# Patient Record
Sex: Male | Born: 1985 | Race: Black or African American | Hispanic: No | Marital: Married | Smoking: Never smoker
Health system: Southern US, Community
[De-identification: ages and names within clinical notes are randomized; demographics above are authoritative.]

## PROBLEM LIST (undated history)

## (undated) DIAGNOSIS — N489 Disorder of penis, unspecified: Secondary | ICD-10-CM

## (undated) HISTORY — PX: NO PAST SURGERIES: SHX2092

---

## 2016-01-29 ENCOUNTER — Emergency Department (HOSPITAL_BASED_OUTPATIENT_CLINIC_OR_DEPARTMENT_OTHER)
Admission: EM | Admit: 2016-01-29 | Discharge: 2016-01-29 | Disposition: A | Discharge status: Home / Self Care | Payer: Worker's Compensation | Attending: Emergency Medicine | Admitting: Emergency Medicine

## 2016-01-29 ENCOUNTER — Encounter (HOSPITAL_BASED_OUTPATIENT_CLINIC_OR_DEPARTMENT_OTHER): Payer: Self-pay | Admitting: *Deleted

## 2016-01-29 ENCOUNTER — Emergency Department (HOSPITAL_BASED_OUTPATIENT_CLINIC_OR_DEPARTMENT_OTHER): Payer: Worker's Compensation

## 2016-01-29 DIAGNOSIS — Y939 Activity, unspecified: Secondary | ICD-10-CM | POA: Insufficient documentation

## 2016-01-29 DIAGNOSIS — S40021A Contusion of right upper arm, initial encounter: Secondary | ICD-10-CM | POA: Diagnosis not present

## 2016-01-29 DIAGNOSIS — W228XXA Striking against or struck by other objects, initial encounter: Secondary | ICD-10-CM | POA: Insufficient documentation

## 2016-01-29 DIAGNOSIS — Y990 Civilian activity done for income or pay: Secondary | ICD-10-CM | POA: Diagnosis not present

## 2016-01-29 DIAGNOSIS — Y929 Unspecified place or not applicable: Secondary | ICD-10-CM | POA: Diagnosis not present

## 2016-01-29 DIAGNOSIS — S4991XA Unspecified injury of right shoulder and upper arm, initial encounter: Secondary | ICD-10-CM | POA: Diagnosis present

## 2016-01-29 NOTE — Discharge Instructions (Signed)
Contusion °A contusion is a deep bruise. Contusions are the result of a blunt injury to tissues and muscle fibers under the skin. The injury causes bleeding under the skin. The skin overlying the contusion may turn blue, purple, or yellow. Minor injuries will give you a painless contusion, but more severe contusions may stay painful and swollen for a few weeks.  °CAUSES  °This condition is usually caused by a blow, trauma, or direct force to an area of the body. °SYMPTOMS  °Symptoms of this condition include: °· Swelling of the injured area. °· Pain and tenderness in the injured area. °· Discoloration. The area may have redness and then turn blue, purple, or yellow. °DIAGNOSIS  °This condition is diagnosed based on a physical exam and medical history. An X-ray, CT scan, or MRI may be needed to determine if there are any associated injuries, such as broken bones (fractures). °TREATMENT  °Specific treatment for this condition depends on what area of the body was injured. In general, the best treatment for a contusion is resting, icing, applying pressure to (compression), and elevating the injured area. This is often called the RICE strategy. Over-the-counter anti-inflammatory medicines may also be recommended for pain control.  °HOME CARE INSTRUCTIONS  °· Rest the injured area. °· If directed, apply ice to the injured area: °· Put ice in a plastic bag. °· Place a towel between your skin and the bag. °· Leave the ice on for 20 minutes, 2-3 times per day. °· If directed, apply light compression to the injured area using an elastic bandage. Make sure the bandage is not wrapped too tightly. Remove and reapply the bandage as directed by your health care provider. °· If possible, raise (elevate) the injured area above the level of your heart while you are sitting or lying down. °· Take over-the-counter and prescription medicines only as told by your health care provider. °SEEK MEDICAL CARE IF: °· Your symptoms do not  improve after several days of treatment. °· Your symptoms get worse. °· You have difficulty moving the injured area. °SEEK IMMEDIATE MEDICAL CARE IF:  °· You have severe pain. °· You have numbness in a hand or foot. °· Your hand or foot turns pale or cold. °  °This information is not intended to replace advice given to you by your health care provider. Make sure you discuss any questions you have with your health care provider. °  °Document Released: 05/21/2005 Document Revised: 05/02/2015 Document Reviewed: 12/27/2014 °Elsevier Interactive Patient Education ©2016 [employer] ° °Cryotherapy °Cryotherapy means treatment with cold. Ice or gel packs can be used to reduce both pain and swelling. Ice is the most helpful within the first 24 to 48 hours after an injury or flare-up from overusing a muscle or joint. Sprains, strains, spasms, burning pain, shooting pain, and aches can all be eased with ice. Ice can also be used when recovering from surgery. Ice is effective, has very few side effects, and is safe for most people to use. °PRECAUTIONS  °Ice is not a safe treatment option for people with: °· Raynaud phenomenon. This is a condition affecting small blood vessels in the extremities. Exposure to cold may cause your problems to return. °· Cold hypersensitivity. There are many forms of cold hypersensitivity, including: °¨ Cold urticaria. Red, itchy hives appear on the skin when the tissues begin to warm after being iced. °¨ Cold erythema. This is a red, itchy rash caused by exposure to cold. °¨ Cold hemoglobinuria. Red blood cells   break down when the tissues begin to warm after being iced. The hemoglobin that carry oxygen are passed into the urine because they cannot combine with blood proteins fast enough. °· Numbness or altered sensitivity in the area being iced. °If you have any of the following conditions, do not use ice until you have discussed cryotherapy with your caregiver: °· Heart conditions, such as  arrhythmia, angina, or chronic heart disease. °· High blood pressure. °· Healing wounds or open skin in the area being iced. °· Current infections. °· Rheumatoid arthritis. °· Poor circulation. °· Diabetes. °Ice slows the blood flow in the region it is applied. This is beneficial when trying to stop inflamed tissues from spreading irritating chemicals to surrounding tissues. However, if you expose your skin to cold temperatures for too long or without the proper protection, you can damage your skin or nerves. Watch for signs of skin damage due to cold. °HOME CARE INSTRUCTIONS °Follow these tips to use ice and cold packs safely. °· Place a dry or damp towel between the ice and skin. A damp towel will cool the skin more quickly, so you may need to shorten the time that the ice is used. °· For a more rapid response, add gentle compression to the ice. °· Ice for no more than 10 to 20 minutes at a time. The bonier the area you are icing, the less time it will take to get the benefits of ice. °· Check your skin after 5 minutes to make sure there are no signs of a poor response to cold or skin damage. °· Rest 20 minutes or more between uses. °· Once your skin is numb, you can end your treatment. You can test numbness by very lightly touching your skin. The touch should be so light that you do not see the skin dimple from the pressure of your fingertip. When using ice, most people will feel these normal sensations in this order: cold, burning, aching, and numbness. °· Do not use ice on someone who cannot communicate their responses to pain, such as small children or people with dementia. °HOW TO MAKE AN ICE PACK °Ice packs are the most common way to use ice therapy. Other methods include ice massage, ice baths, and cryosprays. Muscle creams that cause a cold, tingly feeling do not offer the same benefits that ice offers and should not be used as a substitute unless recommended by your caregiver. °To make an ice pack, do one  of the following: °· Place crushed ice or a bag of frozen vegetables in a sealable plastic bag. Squeeze out the excess air. Place this bag inside another plastic bag. Slide the bag into a pillowcase or place a damp towel between your skin and the bag. °· Mix 3 parts water with 1 part rubbing alcohol. Freeze the mixture in a sealable plastic bag. When you remove the mixture from the freezer, it will be slushy. Squeeze out the excess air. Place this bag inside another plastic bag. Slide the bag into a pillowcase or place a damp towel between your skin and the bag. °SEEK MEDICAL CARE IF: °· You develop white spots on your skin. This may give the skin a blotchy (mottled) appearance. °· Your skin turns blue or pale. °· Your skin becomes waxy or hard. °· Your swelling gets worse. °MAKE SURE YOU:  °· Understand these instructions. °· Will watch your condition. °· Will get help right away if you are not doing well or get worse. °  °  This information is not intended to replace advice given to you by your health care provider. Make sure you discuss any questions you have with your health care provider. °  °Document Released: 04/07/2011 Document Revised: 09/01/2014 Document Reviewed: 04/07/2011 °Elsevier Interactive Patient Education ©2016 Else[employer] Inc. ° °

## 2016-01-29 NOTE — ED Provider Notes (Signed)
CSN: [id number]     Arrival date & time 01/29/16  1930 History   First MD Initiated Contact with Patient 01/29/16 2017     Chief Complaint  Patient presents with  . Arm Injury    HPI   30 year old male presents today with complaints of arm injury. Patient reports he was at work when a piece of wood struck his right forearm. Patient reports immediate pain and swelling to the right forearm. He reports full active range of motion of the wrist, sensation intact to the distal hand and fingers, he does report pain with flexion of the right thumb, full active range of motion and strength. No previous history of the same patient is right-handed, no minutes prior to arrival. Wounds.   History reviewed. No pertinent past medical history. History reviewed. No pertinent past surgical history. No family history on file. Social History  Substance Use Topics  . Smoking status: Never Smoker   . Smokeless tobacco: None  . Alcohol Use: No    Review of Systems  All other systems reviewed and are negative.   Allergies  Review of patient's allergies indicates no known allergies.  Home Medications   Prior to Admission medications   Not on File   BP 128/106 mmHg  Pulse 66  Temp(Src) 97.8 F (36.6 C) (Oral)  Resp 20  Ht  (1.727 m)  Wt 87.544 kg  BMI 29.35 kg/m2  SpO2 100% Physical Exam  Constitutional: He is oriented to person, place, and time. He appears well-developed and well-nourished.  HENT:  Head: Normocephalic and atraumatic.  Eyes: Conjunctivae are normal. Pupils are equal, round, and reactive to light. Right eye exhibits no discharge. Left eye exhibits no discharge. No scleral icterus.  Neck: Normal range of motion. No JVD present. No tracheal deviation present.  Pulmonary/Chest: Effort normal. No stridor.  Musculoskeletal:  Minimal swelling to the right forearm, full active range of motion of the wrist, sensation intact, grip strength 5 out of 5. Minor pain with extension of  the right thumb, radial pulses 2+, no open wounds.  Neurological: He is alert and oriented to person, place, and time. Coordination normal.  Psychiatric: He has a normal mood and affect. His behavior is normal. Judgment and thought content normal.  Nursing note and vitals reviewed.   ED Course  Procedures (including critical care time) Labs Review Labs Reviewed - No data to display  Imaging Review Dg Forearm Right  01/29/2016  CLINICAL DATA:  Right forearm pain status post trauma. EXAM: RIGHT FOREARM - 2 VIEW COMPARISON:  None. FINDINGS: There is no evidence of fracture or other focal bone lesions. Soft tissues are unremarkable. IMPRESSION: Negative. Electronically Signed   By: Ted Mcalpine M.D.   On: 01/29/2016 20:01   I have personally reviewed and evaluated these images and lab results as part of my medical decision-making.   EKG Interpretation None      MDM   Final diagnoses:  Arm contusion, right, initial encounter    Labs:  Imaging:DG forearm right negative  Consults:  Therapeutics:  Discharge Meds:   Assessment/Plan:Patient presents with contusion to his right forearm, negative x-rays, sensation, strength, range of motion intact. Patient will be instructed to use ice, rest, ibuprofen as needed for discomfort. Patient will be given light duty until Monday. 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 Patient verbalizes understanding and agreement today's plan, was given strict return precautions.  Eyvonne MechanicJeffrey Janyia Guion, PA-C 01/29/16 2049  Doug SouSam Jacubowitz, MD 01/29/16 2106

## 2016-01-29 NOTE — ED Notes (Signed)
Right forearm injury at work today. A piece of wood about 18" long hit was coming off a conveyor and hit him in the forearm. No drug screen required.

## 2016-12-25 ENCOUNTER — Other Ambulatory Visit: Payer: Self-pay | Admitting: Urology

## 2017-01-23 DIAGNOSIS — N489 Disorder of penis, unspecified: Secondary | ICD-10-CM

## 2017-01-23 HISTORY — DX: Disorder of penis, unspecified: N48.9

## 2017-01-28 ENCOUNTER — Encounter (HOSPITAL_BASED_OUTPATIENT_CLINIC_OR_DEPARTMENT_OTHER): Payer: Self-pay | Admitting: *Deleted

## 2017-01-28 NOTE — Progress Notes (Signed)
To [facility] at 0700-Hg on arrival-Npo after Mn. 

## 2017-02-06 ENCOUNTER — Ambulatory Visit (HOSPITAL_BASED_OUTPATIENT_CLINIC_OR_DEPARTMENT_OTHER): Payer: Managed Care, Other (non HMO) | Admitting: Anesthesiology

## 2017-02-06 ENCOUNTER — Encounter (HOSPITAL_BASED_OUTPATIENT_CLINIC_OR_DEPARTMENT_OTHER)
Admission: RE | Disposition: A | Discharge status: Home / Self Care | Payer: Self-pay | Source: Ambulatory Visit | Attending: Urology

## 2017-02-06 ENCOUNTER — Encounter (HOSPITAL_BASED_OUTPATIENT_CLINIC_OR_DEPARTMENT_OTHER): Payer: Self-pay | Admitting: *Deleted

## 2017-02-06 ENCOUNTER — Ambulatory Visit (HOSPITAL_BASED_OUTPATIENT_CLINIC_OR_DEPARTMENT_OTHER)
Admission: RE | Admit: 2017-02-06 | Discharge: 2017-02-06 | Disposition: A | Discharge status: Home / Self Care | Payer: Managed Care, Other (non HMO) | Source: Ambulatory Visit | Attending: Urology | Admitting: Urology

## 2017-02-06 DIAGNOSIS — N471 Phimosis: Secondary | ICD-10-CM | POA: Insufficient documentation

## 2017-02-06 DIAGNOSIS — N4889 Other specified disorders of penis: Secondary | ICD-10-CM | POA: Insufficient documentation

## 2017-02-06 DIAGNOSIS — Z8042 Family history of malignant neoplasm of prostate: Secondary | ICD-10-CM | POA: Diagnosis not present

## 2017-02-06 DIAGNOSIS — N469 Male infertility, unspecified: Secondary | ICD-10-CM | POA: Diagnosis not present

## 2017-02-06 DIAGNOSIS — N4821 Abscess of corpus cavernosum and penis: Secondary | ICD-10-CM

## 2017-02-06 DIAGNOSIS — L723 Sebaceous cyst: Secondary | ICD-10-CM | POA: Diagnosis not present

## 2017-02-06 HISTORY — DX: Disorder of penis, unspecified: N48.9

## 2017-02-06 HISTORY — PX: CIRCUMCISION: SHX1350

## 2017-02-06 LAB — POCT HEMOGLOBIN-HEMACUE: Hemoglobin: 18.2 g/dL — ABNORMAL HIGH (ref 13.0–17.0)

## 2017-02-06 SURGERY — CIRCUMCISION, ADULT
Anesthesia: General | Site: Penis

## 2017-02-06 MED ORDER — PROPOFOL 10 MG/ML IV BOLUS
INTRAVENOUS | Status: AC
  Filled 2017-02-06: qty 40

## 2017-02-06 MED ORDER — PROPOFOL 10 MG/ML IV BOLUS
INTRAVENOUS | Status: DC | PRN
  Administered 2017-02-06: 100 mg via INTRAVENOUS
  Administered 2017-02-06: 200 mg via INTRAVENOUS

## 2017-02-06 MED ORDER — MIDAZOLAM HCL 5 MG/5ML IJ SOLN
INTRAMUSCULAR | Status: DC | PRN
  Administered 2017-02-06: 2 mg via INTRAVENOUS

## 2017-02-06 MED ORDER — KETOROLAC TROMETHAMINE 30 MG/ML IJ SOLN
30.0000 mg | INTRAMUSCULAR | Status: DC
  Administered 2017-02-06: 30 mg via INTRAVENOUS
  Filled 2017-02-06: qty 1

## 2017-02-06 MED ORDER — BUPIVACAINE HCL 0.25 % IJ SOLN
INTRAMUSCULAR | Status: DC | PRN
  Administered 2017-02-06: 30 mL

## 2017-02-06 MED ORDER — CEFAZOLIN SODIUM-DEXTROSE 2-4 GM/100ML-% IV SOLN
INTRAVENOUS | Status: AC
  Filled 2017-02-06: qty 100

## 2017-02-06 MED ORDER — PROMETHAZINE HCL 25 MG/ML IJ SOLN
6.2500 mg | INTRAMUSCULAR | Status: DC | PRN
  Filled 2017-02-06: qty 1

## 2017-02-06 MED ORDER — FENTANYL CITRATE (PF) 100 MCG/2ML IJ SOLN
INTRAMUSCULAR | Status: DC | PRN
Start: 2017-02-06 — End: 2017-02-06
  Administered 2017-02-06: 100 ug via INTRAVENOUS

## 2017-02-06 MED ORDER — DOXYCYCLINE HYCLATE 50 MG PO CAPS: 100.0000 mg | ORAL_CAPSULE | Freq: Two times a day (BID) | ORAL | 0 refills | Status: DC

## 2017-02-06 MED ORDER — BACITRACIN-NEOMYCIN-POLYMYXIN 400-5-5000 EX OINT: 1.0000 "application " | TOPICAL_OINTMENT | Freq: Two times a day (BID) | CUTANEOUS | 0 refills | Status: DC

## 2017-02-06 MED ORDER — ACETAMINOPHEN 500 MG PO TABS
1000.0000 mg | ORAL_TABLET | ORAL | Status: AC
  Administered 2017-02-06: 1000 mg via ORAL
  Filled 2017-02-06: qty 2

## 2017-02-06 MED ORDER — DEXAMETHASONE SODIUM PHOSPHATE 4 MG/ML IJ SOLN
INTRAMUSCULAR | Status: DC | PRN
  Administered 2017-02-06: 10 mg via INTRAVENOUS

## 2017-02-06 MED ORDER — ONDANSETRON HCL 4 MG/2ML IJ SOLN
INTRAMUSCULAR | Status: DC | PRN
  Administered 2017-02-06: 4 mg via INTRAVENOUS

## 2017-02-06 MED ORDER — LACTATED RINGERS IV SOLN
INTRAVENOUS | Status: DC
  Administered 2017-02-06 (×3): via INTRAVENOUS
  Filled 2017-02-06: qty 1000

## 2017-02-06 MED ORDER — ACETAMINOPHEN 500 MG PO TABS
ORAL_TABLET | ORAL | Status: AC
  Filled 2017-02-06: qty 2

## 2017-02-06 MED ORDER — LIDOCAINE 2% (20 MG/ML) 5 ML SYRINGE
INTRAMUSCULAR | Status: DC | PRN
  Administered 2017-02-06: 100 mg via INTRAVENOUS

## 2017-02-06 MED ORDER — TRAMADOL-ACETAMINOPHEN 37.5-325 MG PO TABS: 1.0000 | ORAL_TABLET | Freq: Four times a day (QID) | ORAL | 0 refills | Status: DC | PRN

## 2017-02-06 MED ORDER — HYDROMORPHONE HCL 1 MG/ML IJ SOLN
0.2500 mg | INTRAMUSCULAR | Status: DC | PRN
  Filled 2017-02-06: qty 0.5

## 2017-02-06 MED ORDER — MIDAZOLAM HCL 2 MG/2ML IJ SOLN
INTRAMUSCULAR | Status: AC
  Filled 2017-02-06: qty 2

## 2017-02-06 MED ORDER — CEFAZOLIN SODIUM-DEXTROSE 2-4 GM/100ML-% IV SOLN
2.0000 g | INTRAVENOUS | Status: AC
  Administered 2017-02-06: 2 g via INTRAVENOUS
  Filled 2017-02-06: qty 100

## 2017-02-06 MED ORDER — FENTANYL CITRATE (PF) 100 MCG/2ML IJ SOLN
INTRAMUSCULAR | Status: AC
  Filled 2017-02-06: qty 2

## 2017-02-06 SURGICAL SUPPLY — 40 items
BANDAGE CO FLEX L/F 2IN X 5YD (GAUZE/BANDAGES/DRESSINGS) ×3 IMPLANT
BANDAGE COBAN STERILE 2 (GAUZE/BANDAGES/DRESSINGS) ×3 IMPLANT
BLADE CLIPPER SURG (BLADE) IMPLANT
BLADE SURG 15 STRL LF DISP TIS (BLADE) ×1 IMPLANT
BLADE SURG 15 STRL SS (BLADE) ×2
BNDG CONFORM 2 STRL LF (GAUZE/BANDAGES/DRESSINGS) ×3 IMPLANT
BRIEF STRETCH FOR OB PAD LRG (UNDERPADS AND DIAPERS) ×3 IMPLANT
COVER BACK TABLE 60X90IN (DRAPES) ×3 IMPLANT
COVER MAYO STAND STRL (DRAPES) ×3 IMPLANT
DERMABOND ADVANCED (GAUZE/BANDAGES/DRESSINGS) ×2
DERMABOND ADVANCED .7 DNX12 (GAUZE/BANDAGES/DRESSINGS) ×1 IMPLANT
DRAPE LAPAROTOMY 100X72 PEDS (DRAPES) ×3 IMPLANT
ELECT NEEDLE TIP 2.8 STRL (NEEDLE) ×3 IMPLANT
ELECT REM PT RETURN 9FT ADLT (ELECTROSURGICAL) ×3
ELECTRODE REM PT RTRN 9FT ADLT (ELECTROSURGICAL) ×1 IMPLANT
GAUZE SPONGE 4X4 8PLY STR LF (GAUZE/BANDAGES/DRESSINGS) ×3 IMPLANT
GLOVE BIO SURGEON STRL SZ 6.5 (GLOVE) ×2 IMPLANT
GLOVE BIO SURGEON STRL SZ7.5 (GLOVE) ×3 IMPLANT
GLOVE BIO SURGEONS STRL SZ 6.5 (GLOVE) ×1
GLOVE BIOGEL PI IND STRL 6.5 (GLOVE) ×1 IMPLANT
GLOVE BIOGEL PI IND STRL 7.5 (GLOVE) ×1 IMPLANT
GLOVE BIOGEL PI INDICATOR 6.5 (GLOVE) ×2
GLOVE BIOGEL PI INDICATOR 7.5 (GLOVE) ×2
GOWN STRL REUS W/ TWL LRG LVL3 (GOWN DISPOSABLE) IMPLANT
GOWN STRL REUS W/ TWL XL LVL3 (GOWN DISPOSABLE) IMPLANT
GOWN STRL REUS W/TWL LRG LVL3 (GOWN DISPOSABLE) ×6 IMPLANT
GOWN STRL REUS W/TWL XL LVL3 (GOWN DISPOSABLE)
KIT RM TURNOVER CYSTO AR (KITS) ×3 IMPLANT
NEEDLE HYPO 22GX1.5 SAFETY (NEEDLE) IMPLANT
NEEDLE HYPO 25X1 1.5 SAFETY (NEEDLE) IMPLANT
NS IRRIG 500ML POUR BTL (IV SOLUTION) ×3 IMPLANT
PACK BASIN DAY SURGERY FS (CUSTOM PROCEDURE TRAY) ×3 IMPLANT
PENCIL BUTTON HOLSTER BLD 10FT (ELECTRODE) ×3 IMPLANT
SUT MON AB 4-0 PC3 18 (SUTURE) ×12 IMPLANT
SUT VIC AB 3-0 SH 27 (SUTURE) ×2
SUT VIC AB 3-0 SH 27X BRD (SUTURE) ×1 IMPLANT
SYR CONTROL 10ML LL (SYRINGE) ×3 IMPLANT
TOWEL OR 17X24 6PK STRL BLUE (TOWEL DISPOSABLE) ×6 IMPLANT
TRAY DSU PREP LF (CUSTOM PROCEDURE TRAY) ×3 IMPLANT
WATER STERILE IRR 500ML POUR (IV SOLUTION) IMPLANT

## 2017-02-06 NOTE — Anesthesia Postprocedure Evaluation (Signed)
Anesthesia Post Note  Patient: Armanda HeritageJason Perrault  Procedure(s) Performed: Procedure(s) (LRB): CIRCUMCISION ADULT (N/A)     Patient location during evaluation: PACU Anesthesia Type: General Level of consciousness: awake and alert Pain management: pain level controlled Vital Signs Assessment: post-procedure vital signs reviewed and stable Respiratory status: spontaneous breathing, nonlabored ventilation, respiratory function stable and patient connected to nasal cannula oxygen Cardiovascular status: blood pressure returned to baseline and stable Postop Assessment: no signs of nausea or vomiting Anesthetic complications: no    Last Vitals:  Vitals:   02/06/17 1030 02/06/17 1045  BP: (!) 117/57 128/74  Pulse: (!) 58 83  Resp: 19 18  Temp:      Last Pain:  Vitals:   02/06/17 1115  TempSrc:   PainSc: 0-No pain                 Almee Pelphrey,JAMES TERRILL

## 2017-02-06 NOTE — H&P (Signed)
Office Visit Report     12/19/2016   --------------------------------------------------------------------------------   Andre Gerlach. Carey  MRN: [id number]  PRIMARY CARE:    DOB: 1985-09-02, 31 year old Male  REFERRING:     PROVIDER:  Jethro Carey, M.D.    LOCATION:  [facility] [phone]   --------------------------------------------------------------------------------   CC: I have penile edema.  HPI: Andre Carey is a 31 year-old male patient who is here for penile edema.  His penile edema occurred approximately 11/24/1998. He does not have pain. He does not have bruising. He does not have redness in his skin.   His condition does not cause restriction of normal activities.   Distal penile/foreskin mass that has been getting larger over 18 yrs. We have discussed him having a circumcision.     CC: I am infertile.  HPI: He has not gotten a woman pregnant before. He and his mate have been trying to have children for 2 years. His wife has not been evaluated by her gynecologist. He has not had a sperm count done.   He does not have problems with erections. He does not have normal ejaculate volume. He does not have trouble reaching climax.   He does not have a history of exposure to chemicals or fumes. He does not wear boxer shorts. He does not have a history of smoking marijuana. He does not take regular sauna baths or spend time in a hot tub. He has not had injuries to the testicles or scrotum. He has not received radiation therapy. He has not been treated with chemotherapy in the past.   Wife gynecologist is Dr. Festus Carey.     ALLERGIES: None   MEDICATIONS: None   GU PSH: None   NON-GU PSH: None   GU PMH: None   NON-GU PMH: None   FAMILY HISTORY: Deceased - Father Prostate Cancer - Runs in Family   SOCIAL HISTORY: Marital Status: Married Current Smoking Status: Patient has never smoked.   Tobacco Use Assessment Completed: Used Tobacco in last  30 days? Has never drank.  Does not drink caffeine. Patient's occupation Furniture conservator/restorer.    REVIEW OF SYSTEMS:    GU Review Male:   penile cyst/lesion. Patient denies frequent urination, hard to postpone urination, burning/ pain with urination, get up at night to urinate, leakage of urine, stream starts and stops, trouble starting your stream, have to strain to urinate , erection problems, and penile pain.  Gastrointestinal (Upper):   Patient denies nausea, vomiting, and indigestion/ heartburn.  Gastrointestinal (Lower):   Patient denies diarrhea and constipation.  Constitutional:   Patient denies fever, night sweats, weight loss, and fatigue.  Skin:   Patient denies skin rash/ lesion and itching.  Eyes:   Patient denies blurred vision and double vision.  Ears/ Nose/ Throat:   Patient denies sore throat and sinus problems.  Hematologic/Lymphatic:   Patient denies easy bruising and swollen glands.  Cardiovascular:   Patient denies leg swelling and chest pains.  Respiratory:   Patient denies cough and shortness of breath.  Endocrine:   Patient denies excessive thirst.  Musculoskeletal:   Patient denies back pain and joint pain.  Neurological:   Patient denies headaches and dizziness.  Psychologic:   Patient denies depression and anxiety.   VITAL SIGNS:      12/19/2016 02:25 PM  Weight 192 lb / 87.09 kg  Height 67 in / 170.18 cm  BP 117/73 mmHg  Pulse 48 /min  BMI 30.1  kg/m   GU PHYSICAL EXAMINATION:    Anus and Perineum: No hemorrhoids. No anal stenosis. No rectal fissure, no anal fissure. No edema, no dimple, no perineal tenderness, no anal tenderness.  Scrotum: No lesions. No edema. No cysts. No warts.  Epididymides: Right: no spermatocele, no masses, no cysts, no tenderness, no induration, no enlargement. Left: no spermatocele, no masses, no cysts, no tenderness, no induration, no enlargement.  Testes: No tenderness, no swelling, no enlargement left testes. No tenderness, no  swelling, no enlargement right testes. Normal location left testes. Normal location right testes. No mass, no cyst, no varicocele, no hydrocele left testes. No mass, no cyst, no varicocele, no hydrocele right testes.  Urethral Meatus: Normal size. No lesion, no wart, no discharge, no polyp. Normal location.  Penis: Circumcised, no warts, no cracks. No dorsal Peyronie's plaques, no left corporal Peyronie's plaques, no right corporal Peyronie's plaques, no scarring, no warts. No balanitis, no meatal stenosis.  Prostate: 40 gram or 2+ size. Left lobe normal consistency, right lobe normal consistency. Symmetrical lobes. No prostate nodule. Left lobe no tenderness, right lobe no tenderness.  Seminal Vesicles: Nonpalpable.  Sphincter Tone: Normal sphincter. No rectal tenderness. No rectal mass.    MULTI-SYSTEM PHYSICAL EXAMINATION:    Constitutional: Well-nourished. No physical deformities. Normally developed. Good grooming.  Neck: Neck symmetrical, not swollen. Normal tracheal position.  Respiratory: No labored breathing, no use of accessory muscles.   Cardiovascular: Normal temperature, normal extremity pulses, no swelling, no varicosities.  Lymphatic: No enlargement of neck, axillae, groin.  Skin: No paleness, no jaundice, no cyanosis. No lesion, no ulcer, no rash.  Neurologic / Psychiatric: Oriented to time, oriented to place, oriented to person. No depression, no anxiety, no agitation.  Gastrointestinal: No mass, no tenderness, no rigidity, non obese abdomen.  Eyes: Normal conjunctivae. Normal eyelids.  Ears, Nose, Mouth, and Throat: Left ear no scars, no lesions, no masses. Right ear no scars, no lesions, no masses. Nose no scars, no lesions, no masses. Normal hearing. Normal lips.  Musculoskeletal: Normal gait and station of head and neck.     PAST DATA REVIEWED:  Source Of History:  Patient   PROCEDURES:         Scrotal Ultrasound - 16109  Right Testicle: Length: 3.7 cm Height:1.8  cm Width: 2.5 cm   Left Testicle: Length: 3.0 cm Height: 1.7 cm Width: 2.6 cm   Left Testis/Epididymis:  Testicle appears WNLs with blood flow, Multiple sub- centi cystic areas seen in epi head, largest= 0.34 Appears Septated?, No varicocele seen, Hydrocele: Small amount of fluid.  Right Testis/Epididymis:  Testicle seen with blood flow, cystic area seen within= 0.34cm, Calcs noted in epi body, no varicocele seen, Hydrocele: Small amount of fluid.         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 I advised him to have circumcision for this. In addition, the patient is concerned with possible infertility, as he was lightheaded used unprotected intercourse for 2 years, he believes he is having a decreased volume, and because he has testicles are small. Ultrasound of the scrotum shows that the testicle 3.7 x  1.8 x 2.5 cm, with several cysts noted in the left epididymal head. There are no testicular masses. There is no varicocele noted. The internal blood flow is otherwise normal. There is a 0.34 cm cystic area noted in the right testis.   The patient has semen analysis, and, if abnormal, we will begin laboratory testing. It is normal, we will consider other options for him.    PLAN:           Orders X-Rays: Scrotal Ultrasound          Schedule Labs: 1 Week - Semen Analysis Complete  Lab Notes: To be done by Dr. April MansonYalcinkaya  Return Visit/Planned Activity: 4-6 Weeks - Office Visit             Note: To review semen analysis & scrotal u/s results  Return Visit/Planned Activity: Next Available Appointment - Schedule Surgery          Document Letter(s):  Created for Patient: Clinical Summary         Notes:   scrotal u/s  Semen analysis  Hold  blood work pending SA  Recommend circumcision        The information contained in this medical record document is considered private and confidential patient information. This information can only be used for the medical diagnosis and/or medical services that are being provided by the patient's selected caregivers. This information can only be distributed outside of the patient's care if the patient agrees and signs waivers of authorization for this information to be sent to an outside source or route.

## 2017-02-06 NOTE — Anesthesia Procedure Notes (Signed)
Procedure Name: LMA Insertion Date/Time: 02/06/2017 8:45 AM Performed by: Tyrone NineSAUVE, Donatello Kleve F Pre-anesthesia Checklist: Patient identified, Timeout performed, Emergency Drugs available, Suction available and Patient being monitored Patient Re-evaluated:Patient Re-evaluated prior to inductionOxygen Delivery Method: Circle system utilized Preoxygenation: Pre-oxygenation with 100% oxygen Intubation Type: IV induction Ventilation: Mask ventilation without difficulty LMA: LMA inserted LMA Size: 4.0 Number of attempts: 1 Placement Confirmation: breath sounds checked- equal and bilateral and positive ETCO2 Tube secured with: Tape Dental Injury: Teeth and Oropharynx as per pre-operative assessment

## 2017-02-06 NOTE — Op Note (Signed)
Pre-operative diagnosis :  Sebaceous cyst, distal foreskin  Postoperative diagnosis: sebaceous cyst abscess, distal foreskin  Operation: circumcision, culture of sebaceous abscess  Surgeon:  S. Patsi Searsannenbaum, MD  First assistant:  none  Anesthesia:  General LMA  Preparation:  After appropriate premedication, the patient was brought to the operative room, placed on the operating table in the dorsal supine position where general LMA anesthesia was introduced. He remained in the supine position, where the penis was prepped with Betadine solution and draped in the usual fashion. The arm band was double checked. The history was reviewed.  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 I advised him to have circumcision for this. In addition, the patient is concerned with possible infertility, as he was lightheaded used unprotected intercourse for 2 years, he believes he is having a decreased volume, and because he has testicles are small. Ultrasound of the scrotum shows that the testicle 3.7 x 1.8 x 2.5 cm, with several cysts noted in the left epididymal head. There are no testicular masses. There is no varicocele noted. The internal blood flow is otherwise normal. There is a 0.34 cm cystic area noted in the right testis.   The patient has semen analysis, and, if abnormal, we will begin laboratory testing. It is normal, we will consider other options for him.    Statement of  Likelihood of Success: Excellent. TIME-OUT observed.:  Procedure: Outline of circumcision was accomplished using a blue marking pen, and the pericarinal and periglandular areas. In the periglandular area, a large 1.5 cm sebaceous cyst was identified, in the midline. This required dissection at of the  frenulum off of the glans at 6:00. Following dissection, the frenular attachment was incised, and ligated with 3-0 Vicryl suture. The proximal ligature yielded fluid, however, which appeared to be pus. This was cultured and drained. The wound was irrigated with saline.  Circumcising sizing incisions were concluded using a 15 blade, leaving a collar of tissue around the corona of the penis.  4 separate quadrants of 4-0 Monocryl were then created, and each quadrant was closed with 4-0 Monocryl suture. Sterile dressing was applied. The patient was awakened and taken to recovery in good condition. Note that a penile block was placed at the beginning of the procedure, using 30 mL of 1/4% Marcaine plain solution.

## 2017-02-06 NOTE — Anesthesia Preprocedure Evaluation (Addendum)
Anesthesia Evaluation  Patient identified by MRN, date of birth, ID band Patient awake    Reviewed: Allergy & Precautions, NPO status , Patient's Chart, lab work & pertinent test results  Airway Mallampati: I       Dental no notable dental hx. (+) Teeth Intact, Partial Upper, Dental Advisory Given,    Pulmonary neg pulmonary ROS,    breath sounds clear to auscultation       Cardiovascular Exercise Tolerance: Good negative cardio ROS   Rhythm:Regular Rate:Normal     Neuro/Psych negative neurological ROS  negative psych ROS   GI/Hepatic negative GI ROS, Neg liver ROS,   Endo/Other  negative endocrine ROS  Renal/GU negative Renal ROS   Penile lesion negative genitourinary   Musculoskeletal negative musculoskeletal ROS (+)   Abdominal   Peds negative pediatric ROS (+)  Hematology negative hematology ROS (+)   Anesthesia Other Findings   Reproductive/Obstetrics negative OB ROS                           Anesthesia Physical Anesthesia Plan  ASA: I  Anesthesia Plan: General   Post-op Pain Management:    Induction: Intravenous  PONV Risk Score and Plan: 2 and Ondansetron and Dexamethasone  Airway Management Planned: LMA  Additional Equipment:   Intra-op Plan:   Post-operative Plan: Extubation in OR  Informed Consent: I have reviewed the patients History and Physical, chart, labs and discussed the procedure including the risks, benefits and alternatives for the proposed anesthesia with the patient or authorized representative who has indicated his/her understanding and acceptance.   Dental advisory given  Plan Discussed with: CRNA  Anesthesia Plan Comments:         Anesthesia Quick Evaluation

## 2017-02-06 NOTE — Interval H&P Note (Signed)
History and Physical Interval Note:  02/06/2017 8:37 AM  Andre Carey  has presented today for surgery, with the diagnosis of DISTAL PENILE SKIN LESION  The various methods of treatment have been discussed with the patient and family. After consideration of risks, benefits and other options for treatment, the patient has consented to  Procedure(s): CIRCUMCISION ADULT (N/A) as a surgical intervention .  The patient's history has been reviewed, patient examined, no change in status, stable for surgery.  I have reviewed the patient's chart and labs.  Questions were answered to the patient's satisfaction.     Kemar Pandit I Gagandeep Kossman

## 2017-02-06 NOTE — Transfer of Care (Signed)
Immediate Anesthesia Transfer of Care Note  Patient: Andre HeritageJason Carey  Procedure(s) Performed: Procedure(s): CIRCUMCISION ADULT (N/A)  Patient Location: PACU  Anesthesia Type:General  Level of Consciousness: awake, alert , oriented and patient cooperative  Airway & Oxygen Therapy: Patient Spontanous Breathing and Patient connected to nasal cannula oxygen  Post-op Assessment: Report given to RN and Post -op Vital signs reviewed and stable  Post vital signs: Reviewed and stable  Last Vitals:  Vitals:   02/06/17 0719  BP: 125/67  Pulse: (!) 57  Resp: 16  Temp: 36.8 C    Last Pain:  Vitals:   02/06/17 0719  TempSrc: Oral      Patients Stated Pain Goal: 5 (02/06/17 0737)  Complications: No apparent anesthesia complications

## 2017-02-06 NOTE — Discharge Instructions (Addendum)
Circumcision-Home Care Instructions  The following instructions have been prepared to help you care for yourself upon your return home today.   Wound Care & Hygiene:   You may apply ice to the penis.  This may help to decrease swelling.  Remove the dressing tomorrow.  If the dressing falls off before then, leave it off.  You may shower or bathe in 48 hours  Gently wash the penis with soap and water.  The stitches do not need to be removed.  Activity:  Do not drive or operate any equipment today.  The effects of anesthesia are still present, drowsiness may result.  Rest today, not necessarily flat bed rest, just take it easy.  You may resume your normal activity in one to two days or as indicated by your physician.  Sexual Activity:  Erection and sexual relations should be avoided for *2 weeks.  Return to Work:  One to two days or as indicated by your physician   Diet:  Drink liquids or eat a very light diet this evening.  You may resume a regular diet tomorrow.  General Expectations of your surgery:   You may have a small amount of bleeding  The penis will be swollen and bruised for approximately one week  You may wake during the night with an erection, usually this is caused by having a full bladder so you should try to urinate (pass your water) to relieve the erection or apply ice to the penis  Unexpected Observations - Call your doctor if these occur!  Persistent or heavy bleeding  Temperature of 101 degrees or more  Severe pain not relieved by medication  Return to Office .  Call to schedule your appointment   Additional Instructions:  Use neosporin ointment on penis 2x/day.      Post Anesthesia Home Care Instructions  Activity: Get plenty of rest for the remainder of the day. A responsible individual must stay with you for 24 hours following the procedure.  For the next 24 hours, DO NOT: -Drive a car -Advertising copywriterperate machinery -Drink alcoholic beverages -Take any  medication unless instructed by your physician -Make any legal decisions or sign important papers.  Meals: Start with liquid foods such as gelatin or soup. Progress to regular foods as tolerated. Avoid greasy, spicy, heavy foods. If nausea and/or vomiting occur, drink only clear liquids until the nausea and/or vomiting subsides. Call your physician if vomiting continues.  Special Instructions/Symptoms: Your throat may feel dry or sore from the anesthesia or the breathing tube placed in your throat during surgery. If this causes discomfort, gargle with warm salt water. The discomfort should disappear within 24 hours.  If you had a scopolamine patch placed behind your ear for the management of post- operative nausea and/or vomiting:  1. The medication in the patch is effective for 72 hours, after which it should be removed.  Wrap patch in a tissue and discard in the trash. Wash hands thoroughly with soap and water. 2. You may remove the patch earlier than 72 hours if you experience unpleasant side effects which may include dry mouth, dizziness or visual disturbances. 3. Avoid touching the patch. Wash your hands with soap and water after contact with the patch.

## 2017-02-09 ENCOUNTER — Encounter (HOSPITAL_BASED_OUTPATIENT_CLINIC_OR_DEPARTMENT_OTHER): Payer: Self-pay | Admitting: Urology

## 2017-02-11 LAB — AEROBIC/ANAEROBIC CULTURE (SURGICAL/DEEP WOUND): CULTURE: NO GROWTH

## 2017-02-11 LAB — AEROBIC/ANAEROBIC CULTURE W GRAM STAIN (SURGICAL/DEEP WOUND)

## 2020-05-01 ENCOUNTER — Inpatient Hospital Stay (HOSPITAL_COMMUNITY)
Admission: EM | Admit: 2020-05-01 | Discharge: 2020-05-05 | DRG: 871 | Disposition: A | Discharge status: Home / Self Care | Payer: No Typology Code available for payment source | Attending: Internal Medicine | Admitting: Internal Medicine

## 2020-05-01 ENCOUNTER — Emergency Department (HOSPITAL_COMMUNITY): Payer: No Typology Code available for payment source

## 2020-05-01 ENCOUNTER — Encounter (HOSPITAL_COMMUNITY): Payer: Self-pay | Admitting: Emergency Medicine

## 2020-05-01 ENCOUNTER — Inpatient Hospital Stay (HOSPITAL_COMMUNITY): Payer: No Typology Code available for payment source

## 2020-05-01 DIAGNOSIS — J9601 Acute respiratory failure with hypoxia: Secondary | ICD-10-CM | POA: Diagnosis present

## 2020-05-01 DIAGNOSIS — R6520 Severe sepsis without septic shock: Secondary | ICD-10-CM | POA: Diagnosis present

## 2020-05-01 DIAGNOSIS — D696 Thrombocytopenia, unspecified: Secondary | ICD-10-CM | POA: Diagnosis present

## 2020-05-01 DIAGNOSIS — D6959 Other secondary thrombocytopenia: Secondary | ICD-10-CM | POA: Diagnosis present

## 2020-05-01 DIAGNOSIS — U071 COVID-19: Secondary | ICD-10-CM | POA: Diagnosis present

## 2020-05-01 DIAGNOSIS — R042 Hemoptysis: Secondary | ICD-10-CM | POA: Diagnosis present

## 2020-05-01 DIAGNOSIS — R739 Hyperglycemia, unspecified: Secondary | ICD-10-CM | POA: Diagnosis present

## 2020-05-01 DIAGNOSIS — D72819 Decreased white blood cell count, unspecified: Secondary | ICD-10-CM | POA: Diagnosis present

## 2020-05-01 DIAGNOSIS — E871 Hypo-osmolality and hyponatremia: Secondary | ICD-10-CM | POA: Diagnosis present

## 2020-05-01 DIAGNOSIS — J1282 Pneumonia due to coronavirus disease 2019: Secondary | ICD-10-CM | POA: Diagnosis present

## 2020-05-01 DIAGNOSIS — R748 Abnormal levels of other serum enzymes: Secondary | ICD-10-CM | POA: Diagnosis present

## 2020-05-01 DIAGNOSIS — R0902 Hypoxemia: Secondary | ICD-10-CM | POA: Diagnosis present

## 2020-05-01 DIAGNOSIS — A4189 Other specified sepsis: Principal | ICD-10-CM | POA: Diagnosis present

## 2020-05-01 DIAGNOSIS — R7303 Prediabetes: Secondary | ICD-10-CM | POA: Diagnosis present

## 2020-05-01 LAB — COMPREHENSIVE METABOLIC PANEL
ALT: 104 U/L — ABNORMAL HIGH (ref 0–44)
AST: 181 U/L — ABNORMAL HIGH (ref 15–41)
Albumin: 3.1 g/dL — ABNORMAL LOW (ref 3.5–5.0)
Alkaline Phosphatase: 52 U/L (ref 38–126)
Anion gap: 9 (ref 5–15)
BUN: 8 mg/dL (ref 6–20)
CO2: 25 mmol/L (ref 22–32)
Calcium: 8.3 mg/dL — ABNORMAL LOW (ref 8.9–10.3)
Chloride: 95 mmol/L — ABNORMAL LOW (ref 98–111)
Creatinine, Ser: 1.41 mg/dL — ABNORMAL HIGH (ref 0.61–1.24)
GFR calc Af Amer: >60 mL/min (ref >60)
GFR calc non Af Amer: >60 mL/min (ref >60)
Glucose, Bld: 132 mg/dL — ABNORMAL HIGH (ref 70–99)
Potassium: 3.7 mmol/L (ref 3.5–5.1)
Sodium: 129 mmol/L — ABNORMAL LOW (ref 135–145)
Total Bilirubin: 0.8 mg/dL (ref 0.3–1.2)
Total Protein: 6.5 g/dL (ref 6.5–8.1)

## 2020-05-01 LAB — LACTIC ACID, PLASMA: Lactic Acid, Venous: 1.4 mmol/L (ref 0.5–1.9)

## 2020-05-01 LAB — CBC
HCT: 49.6 % (ref 39.0–52.0)
Hemoglobin: 16.8 g/dL (ref 13.0–17.0)
MCH: 29.1 pg (ref 26.0–34.0)
MCHC: 33.9 g/dL (ref 30.0–36.0)
MCV: 85.8 fL (ref 80.0–100.0)
Platelets: 131 10*3/uL — ABNORMAL LOW (ref 150–400)
RBC: 5.78 MIL/uL (ref 4.22–5.81)
RDW: 11.7 % (ref 11.5–15.5)
WBC: 3.3 10*3/uL — ABNORMAL LOW (ref 4.0–10.5)
nRBC: 0 % (ref 0.0–0.2)

## 2020-05-01 LAB — FIBRINOGEN: Fibrinogen: 531 mg/dL — ABNORMAL HIGH (ref 210–475)

## 2020-05-01 LAB — SARS CORONAVIRUS 2 BY RT PCR (HOSPITAL ORDER, PERFORMED IN ~~LOC~~ HOSPITAL LAB): SARS Coronavirus 2: POSITIVE — AB

## 2020-05-01 LAB — BASIC METABOLIC PANEL
Anion gap: 13 (ref 5–15)
BUN: 8 mg/dL (ref 6–20)
CO2: 25 mmol/L (ref 22–32)
Calcium: 8.8 mg/dL — ABNORMAL LOW (ref 8.9–10.3)
Chloride: 94 mmol/L — ABNORMAL LOW (ref 98–111)
Creatinine, Ser: 1.44 mg/dL — ABNORMAL HIGH (ref 0.61–1.24)
GFR calc Af Amer: >60 mL/min (ref >60)
GFR calc non Af Amer: >60 mL/min (ref >60)
Glucose, Bld: 135 mg/dL — ABNORMAL HIGH (ref 70–99)
Potassium: 3.8 mmol/L (ref 3.5–5.1)
Sodium: 132 mmol/L — ABNORMAL LOW (ref 135–145)

## 2020-05-01 LAB — TRIGLYCERIDES: Triglycerides: 150 mg/dL — ABNORMAL HIGH (ref <150)

## 2020-05-01 LAB — TROPONIN I (HIGH SENSITIVITY)
Troponin I (High Sensitivity): 15 ng/L (ref <18)
Troponin I (High Sensitivity): 13 ng/L (ref <18)

## 2020-05-01 LAB — PROCALCITONIN: Procalcitonin: 0.45 ng/mL

## 2020-05-01 LAB — HIV ANTIBODY (ROUTINE TESTING W REFLEX): HIV Screen 4th Generation wRfx: NONREACTIVE

## 2020-05-01 LAB — D-DIMER, QUANTITATIVE: D-Dimer, Quant: 1.82 ug/mL-FEU — ABNORMAL HIGH (ref 0.00–0.50)

## 2020-05-01 LAB — C-REACTIVE PROTEIN: CRP: 6.6 mg/dL — ABNORMAL HIGH (ref <1.0)

## 2020-05-01 LAB — LACTATE DEHYDROGENASE: LDH: 755 U/L — ABNORMAL HIGH (ref 98–192)

## 2020-05-01 LAB — BRAIN NATRIURETIC PEPTIDE: B Natriuretic Peptide: 7.6 pg/mL (ref 0.0–100.0)

## 2020-05-01 LAB — HEPATITIS B SURFACE ANTIGEN: Hepatitis B Surface Ag: NONREACTIVE

## 2020-05-01 LAB — FERRITIN: Ferritin: 3111 ng/mL — ABNORMAL HIGH (ref 24–336)

## 2020-05-01 MED ORDER — METHYLPREDNISOLONE SODIUM SUCC 125 MG IJ SOLR
1.0000 mg/kg | Freq: Two times a day (BID) | INTRAMUSCULAR | Status: DC
  Administered 2020-05-01: 86.25 mg via INTRAVENOUS
  Filled 2020-05-01: qty 2

## 2020-05-01 MED ORDER — HYDROCOD POLST-CPM POLST ER 10-8 MG/5ML PO SUER: 5.0000 mL | Freq: Two times a day (BID) | ORAL | Status: DC | PRN

## 2020-05-01 MED ORDER — PREDNISONE 20 MG PO TABS: 50.0000 mg | ORAL_TABLET | Freq: Every day | ORAL | Status: DC

## 2020-05-01 MED ORDER — SODIUM CHLORIDE 0.9 % IV SOLN
200.0000 mg | Freq: Once | INTRAVENOUS | Status: AC
  Administered 2020-05-01: 200 mg via INTRAVENOUS
  Filled 2020-05-01: qty 40

## 2020-05-01 MED ORDER — LACTATED RINGERS IV BOLUS: 500.0000 mL | Freq: Once | INTRAVENOUS | Status: DC

## 2020-05-01 MED ORDER — ALBUTEROL SULFATE HFA 108 (90 BASE) MCG/ACT IN AERS
2.0000 | INHALATION_SPRAY | Freq: Four times a day (QID) | RESPIRATORY_TRACT | Status: DC
  Administered 2020-05-01 – 2020-05-04 (×12): 2 via RESPIRATORY_TRACT
  Filled 2020-05-01 (×4): qty 6.7

## 2020-05-01 MED ORDER — ZINC SULFATE 220 (50 ZN) MG PO CAPS
220.0000 mg | ORAL_CAPSULE | Freq: Every day | ORAL | Status: DC
  Administered 2020-05-01 – 2020-05-04 (×4): 220 mg via ORAL
  Filled 2020-05-01 (×5): qty 1

## 2020-05-01 MED ORDER — SODIUM CHLORIDE 0.9 % IV SOLN
100.0000 mg | INTRAVENOUS | Status: DC
  Filled 2020-05-01: qty 20

## 2020-05-01 MED ORDER — ACETAMINOPHEN 325 MG PO TABS
650.0000 mg | ORAL_TABLET | Freq: Once | ORAL | Status: AC | PRN
  Administered 2020-05-01: 650 mg via ORAL

## 2020-05-01 MED ORDER — ENOXAPARIN SODIUM 40 MG/0.4ML ~~LOC~~ SOLN
40.0000 mg | SUBCUTANEOUS | Status: DC
  Administered 2020-05-01: 40 mg via SUBCUTANEOUS
  Filled 2020-05-01: qty 0.4

## 2020-05-01 MED ORDER — DEXAMETHASONE SODIUM PHOSPHATE 10 MG/ML IJ SOLN: 10.0000 mg | Freq: Once | INTRAMUSCULAR | Status: DC

## 2020-05-01 MED ORDER — ACETAMINOPHEN 325 MG PO TABS: 650.0000 mg | ORAL_TABLET | Freq: Four times a day (QID) | ORAL | Status: DC | PRN

## 2020-05-01 MED ORDER — ACETAMINOPHEN 325 MG PO TABS: 325.0000 mg | ORAL_TABLET | Freq: Once | ORAL | Status: DC

## 2020-05-01 MED ORDER — GUAIFENESIN-DM 100-10 MG/5ML PO SYRP: 10.0000 mL | ORAL_SOLUTION | ORAL | Status: DC | PRN

## 2020-05-01 MED ORDER — SODIUM CHLORIDE 0.9 % IV SOLN: 200.0000 mg | Freq: Once | INTRAVENOUS | Status: DC

## 2020-05-01 MED ORDER — SODIUM CHLORIDE 0.9 % IV SOLN: 100.0000 mg | Freq: Every day | INTRAVENOUS | Status: DC

## 2020-05-01 MED ORDER — ONDANSETRON HCL 4 MG/2ML IJ SOLN: 4.0000 mg | Freq: Four times a day (QID) | INTRAMUSCULAR | Status: DC | PRN

## 2020-05-01 MED ORDER — ASCORBIC ACID 500 MG PO TABS
500.0000 mg | ORAL_TABLET | Freq: Every day | ORAL | Status: DC
  Administered 2020-05-01 – 2020-05-05 (×5): 500 mg via ORAL
  Filled 2020-05-01 (×5): qty 1

## 2020-05-01 MED ORDER — ONDANSETRON HCL 4 MG PO TABS: 4.0000 mg | ORAL_TABLET | Freq: Four times a day (QID) | ORAL | Status: DC | PRN

## 2020-05-01 NOTE — ED Provider Notes (Signed)
[facility] EMERGENCY DEPARTMENT Provider Note   CSN: [id number] Arrival date & time: 05/01/20  1442     History Chief Complaint  Patient presents with  . Shortness of Breath  . Covid Positive    Andre Carey is a 34 y.o. male.  HPI Patient is a 34 year old male with no pertinent past medical history no chronic medical conditions on no medications presented today with 1 week of myalgias, cough, congestion, fatigue, malaise, fevers, chills nausea without vomiting and loose stools 1 episodes per day.   Patient states that he took home test on Sunday which was positive.  He has not been tested formally.  He has taken no medications prior to arrival today but has taken Tylenol sporadically over the past week.  He denies any chest pain or chest tightness but states that he does feel that he is having to work harder to breathe and that is more difficult progressively over the past 3 days.  He states that today he felt that he can only tolerate his symptoms and came to emergency department for evaluation.   He denies any history of blood clots, no medication no murmur is not a cancer patient, no recent surgery or immobilizations.  No unilateral leg swelling.  He does endorse some hemoptysis but states that it has been scant and irregular mostly when he is coughing very hard.  He denies any chest pain.     Past Medical History:  Diagnosis Date  . Penile lesion 01/2017    There are no problems to display for this patient.   Past Surgical History:  Procedure Laterality Date  . CIRCUMCISION N/A 02/06/2017   Procedure: CIRCUMCISION ADULT;  Surgeon: Jethro Bolus, MD;  Location: [facility];  Service: Urology;  Laterality: N/A;  . NO PAST SURGERIES         No family history on file.  Social History   Tobacco Use  . Smoking status: Never Smoker  . Smokeless tobacco: Never Used  Substance Use Topics  . Alcohol use: No  . Drug use: No     Home Medications Prior to Admission medications   Not on File    Allergies    Patient has no known allergies.  Review of Systems   Review of Systems  Constitutional: Positive for activity change, appetite change, chills, fatigue and fever. Negative for unexpected weight change.  HENT: Positive for congestion, rhinorrhea and sore throat.   Eyes: Negative for pain.  Respiratory: Positive for cough, chest tightness and shortness of breath.   Cardiovascular: Negative for chest pain and leg swelling.  Gastrointestinal: Positive for diarrhea and nausea. Negative for abdominal pain and vomiting.  Genitourinary: Negative for dysuria.  Musculoskeletal: Positive for myalgias.  Skin: Negative for rash.  Neurological: Negative for dizziness and headaches.    Physical Exam Updated Vital Signs BP 120/90   Pulse 98   Temp (!) 103 F (39.4 C) (Oral)   Resp (!) 39   SpO2 92%   Physical Exam Vitals and nursing note reviewed.  Constitutional:      General: He is not in acute distress.    Comments: Patient is 34 year old male appears stated age Appears fatigued.  His breathing rapidly but no significant increased work of breathing.  Currently on nonrebreather mask.  HENT:     Head: Normocephalic and atraumatic.     Nose: Nose normal.     Mouth/Throat:     Mouth: Mucous membranes are dry.  Eyes:  General: No scleral icterus. Cardiovascular:     Rate and Rhythm: Normal rate and regular rhythm.     Pulses: Normal pulses.     Heart sounds: Normal heart sounds.  Pulmonary:     Effort: No respiratory distress.     Breath sounds: Rales present. No wheezing.     Comments: Crackles in all fields some decreased breath sounds in left lower lung. Tachypnea No wheezing or stridor. Abdominal:     Palpations: Abdomen is soft.     Tenderness: There is no abdominal tenderness. There is no guarding or rebound.  Musculoskeletal:     Cervical back: Normal range of motion.     Right lower  leg: No edema.     Left lower leg: No edema.     Comments: No unilateral leg swelling.  No calf tenderness palpation  Skin:    General: Skin is warm and dry.     Capillary Refill: Capillary refill takes less than 2 seconds.  Neurological:     Mental Status: He is alert and oriented to person, place, and time. Mental status is at baseline.  Psychiatric:        Mood and Affect: Mood normal.        Behavior: Behavior normal.     ED Results / Procedures / Treatments   Labs (all labs ordered are listed, but only abnormal results are displayed) Labs Reviewed  CBC - Abnormal; Notable for the following components:      Result Value   WBC 3.3 (*)    Platelets 131 (*)    All other components within normal limits  BASIC METABOLIC PANEL - Abnormal; Notable for the following components:   Sodium 132 (*)    Chloride 94 (*)    Glucose, Bld 135 (*)    Creatinine, Ser 1.44 (*)    Calcium 8.8 (*)    All other components within normal limits  COMPREHENSIVE METABOLIC PANEL - Abnormal; Notable for the following components:   Sodium 129 (*)    Chloride 95 (*)    Glucose, Bld 132 (*)    Creatinine, Ser 1.41 (*)    Calcium 8.3 (*)    Albumin 3.1 (*)    AST 181 (*)    ALT 104 (*)    All other components within normal limits  D-DIMER, QUANTITATIVE (NOT AT [facility]) - Abnormal; Notable for the following components:   D-Dimer, Quant 1.82 (*)    All other components within normal limits  LACTATE DEHYDROGENASE - Abnormal; Notable for the following components:   LDH 755 (*)    All other components within normal limits  FERRITIN - Abnormal; Notable for the following components:   Ferritin 3,111 (*)    All other components within normal limits  FIBRINOGEN - Abnormal; Notable for the following components:   Fibrinogen 531 (*)    All other components within normal limits  C-REACTIVE PROTEIN - Abnormal; Notable for the following components:   CRP 6.6 (*)    All other components within normal limits   TRIGLYCERIDES - Abnormal; Notable for the following components:   Triglycerides 150 (*)    All other components within normal limits  SARS CORONAVIRUS 2 BY RT PCR (HOSPITAL ORDER, PERFORMED IN [facility])  CULTURE, BLOOD (ROUTINE X 2)  CULTURE, BLOOD (ROUTINE X 2)  LACTIC ACID, PLASMA  LACTIC ACID, PLASMA  PROCALCITONIN    EKG EKG Interpretation  Date/Time:  Tuesday May 01 2020 14:51:53 EDT Ventricular Rate:  124  PR Interval:  134 QRS Duration: 90 QT Interval:  292 QTC Calculation: 419 R Axis:   39 Text Interpretation: Sinus tachycardia Abnormal ECG Confirmed by Gerhard Munch [phone]) on 05/01/2020 3:49:42 PM   Radiology DG Chest Portable 1 View  Result Date: 05/01/2020 CLINICAL DATA:  Hypoxia. EXAM: PORTABLE CHEST 1 VIEW COMPARISON:  None. FINDINGS: The heart size and mediastinal contours are within normal limits. No pneumothorax or pleural effusion is noted. Bilateral patchy airspace opacities are noted concerning for multifocal pneumonia, possibly due to COVID-19. The visualized skeletal structures are unremarkable. IMPRESSION: Bilateral patchy airspace opacities are noted concerning for multifocal pneumonia, possibly due to COVID-19. Electronically Signed   By: Lupita Raider M.D.   On: 05/01/2020 15:18    Procedures .Critical Care Performed by: Gailen Shelter, PA Authorized by: Gailen Shelter, PA   Critical care provider statement:    Critical care time (minutes):  45   Critical care was necessary to treat or prevent imminent or life-threatening deterioration of the following conditions:  Respiratory failure (Acute respiratory failure secondary to COVID-19.)   Critical care was time spent personally by me on the following activities:  Discussions with consultants, evaluation of patient's response to treatment, examination of patient, ordering and performing treatments and interventions, ordering and review of laboratory studies, ordering and review  of radiographic studies, pulse oximetry, re-evaluation of patient's condition, obtaining history from patient or surrogate and review of old charts   (including critical care time)  Medications Ordered in ED Medications  lactated ringers bolus 500 mL (has no administration in time range)  dexamethasone (DECADRON) injection 10 mg (has no administration in time range)  remdesivir 200 mg in sodium chloride 0.9% 250 mL IVPB (has no administration in time range)    Followed by  remdesivir 200 mg in sodium chloride 0.9% 250 mL IVPB (has no administration in time range)  acetaminophen (TYLENOL) tablet 650 mg (650 mg Oral Given 05/01/20 1457)    ED Course  I have reviewed the triage vital signs and the nursing notes.  Pertinent labs & imaging results that were available during my care of the patient were reviewed by me and considered in my medical decision making (see chart for details).  Patient is 34 year old unvaccinated male presented today for shortness of breath is progressively worse over the past 1 week.  Significantly worsened over the past few days.  He took a home Covid test which was positive.  He came to emergency department febrile and tachycardic here given Tylenol improved to heart rate of 96 and no longer has a fever.  He is tachypneic with no significant increased work of breathing. No significant factors that increase my concern for pulmonary embolism although he does endorse that hemoptysis.  He denies any chest pain. I doubt pulmonary embolism/myocarditis.   CMP with some transaminitis and mildly elevated creatinine baseline for patient.  Sodium mildly low at 129.  Inflammatory markers are elevated.  CBC with mild leukopenia no leukocytosis and no anemia.   Clinical Course as of May 01 1799  Tue May 01, 2020  1759 Discussed with Dr. Jacqulyn Bath of hospitalist service. She will admit patient to hospital.    [WF]    Clinical Course User Index [WF] Gailen Shelter, [place]   MDM  Rules/Calculators/A&P                          Andre Carey was evaluated in Emergency Department on 05/01/2020  for the symptoms described in the history of present illness. He was evaluated in the context of the global COVID-19 pandemic, which necessitated consideration that the patient might be at risk for infection with the SARS-CoV-2 virus that causes COVID-19. Institutional protocols and algorithms that pertain to the evaluation of patients at risk for COVID-19 are in a state of rapid change based on information released by regulatory bodies including the CDC and federal and state organizations. These policies and algorithms were followed during the patient's care in the ED.  Final Clinical Impression(s) / ED Diagnoses Final diagnoses:  COVID-19  Hypoxia    Rx / DC Orders ED Discharge Orders    None       Gailen ShelterFondaw, Ajeenah Heiny S, [place] 05/01/20 2018    Virgina NorfolkCuratolo, Adam, DO 05/01/20 2117

## 2020-05-01 NOTE — ED Triage Notes (Signed)
Pt covid + since last week. Endorses cough and SOB. O2 sats RA 71%, pt placed on NRB. Denies any pain.

## 2020-05-01 NOTE — ED Notes (Signed)
Pt transitioned from NRB at 15L in lobby to 6L [place] in room, now sats 90%on 6L [place]

## 2020-05-01 NOTE — H&P (Signed)
History and Physical    Andre Carey KYH:[id number] DOB: 1985/09/01 DOA: 05/01/2020  PCP: Patient, No Pcp Per  Patient coming from: home  I have personally briefly reviewed patient's old medical records in [facility]  Chief Complaint: Shortness of breath and Covid positive.  HPI: Andre Carey is a 34 y.o. male with no significant past medical history presents to emergency department with worsening shortness of breath.    Patient tells me that he took Covid home test on Sunday which came back positive.  Reports myalgia, fever, chills, cough, congestion, generalized weakness, fatigue, no energy, decreased appetite, loose stool since 1 week.  He has been taking Tylenol as needed for fever at home.  Denies headache, blurry vision, lightheadedness, dizziness, chest pain, wheezing, leg swelling/redness, recent travel, history of DVT/PE, palpitation, abdominal pain, urinary changes.  He tells me that his symptoms started getting worse since 3 days therefore he came to ER for further evaluation and management.  No history of smoking, alcohol, illicit drug use. He does not have a medical problems and does not take any medications at home. He is not vaccinated against COVID-19.  ED Course: Upon arrival to ED: Patient had fever of 103.3, tachycardic, tachypneic, requiring nonrebreather then on 4 L of oxygen via nasal cannula, CBC shows leukopenia of 3.3, platelet: 131, CMP shows sodium of 132, AST: 181, ALT: 104, D-dimer: 1.82, CRP: 6.6, chest x-ray shows multifocal pneumonia possibility due to COVID-19.  Repeat COVID-19 test is pending.  Elevated inflammatory markers.  [facility] hospitalist consulted for admission for acute hypoxemic respiratory failure secondary to COVID-19 pneumonia.  Review of Systems: As per HPI otherwise negative.    Past Medical History:  Diagnosis Date  . Penile lesion 01/2017    Past Surgical History:  Procedure Laterality Date  . CIRCUMCISION N/A 02/06/2017    Procedure: CIRCUMCISION ADULT;  Surgeon: Jethro Bolus, MD;  Location: [facility];  Service: Urology;  Laterality: N/A;  . NO PAST SURGERIES       reports that he has never smoked. He has never used smokeless tobacco. He reports that he does not drink alcohol and does not use drugs.  No Known Allergies  No family history on file.  Prior to Admission medications   Not on File    Physical Exam: Vitals:   05/01/20 1515 05/01/20 1530 05/01/20 1600 05/01/20 1700  BP:   117/87 120/90  Pulse:  (!) 111 (!) 107 98  Resp:  (!) 56 (!) 42 (!) 39  Temp: (!) 103 F (39.4 C)     TempSrc: Oral     SpO2:  (!) 89% 92% 92%    Constitutional: NAD, calm, comfortable, on 4 L of oxygen via nasal cannula Eyes: PERRL, lids and conjunctivae normal ENMT: Mucous membranes are moist. Posterior pharynx clear of any exudate or lesions.Normal dentition.  Neck: normal, supple, no masses, no thyromegaly Respiratory: Tachypneic, bilateral crackles.  No wheezing. Cardiovascular: Tachycardic, no murmurs / rubs / gallops. No extremity edema. 2+ pedal pulses. No carotid bruits.  Abdomen: no tenderness, no masses palpated. No hepatosplenomegaly. Bowel sounds positive.  Musculoskeletal: no clubbing / cyanosis. No joint deformity upper and lower extremities. Good ROM, no contractures. Normal muscle tone.  Skin: no rashes, lesions, ulcers. No induration Neurologic: CN 2-12 grossly intact. Sensation intact, DTR normal. Strength 5/5 in all 4.  Psychiatric: Normal judgment and insight. Alert and oriented x 3. Normal mood.    Labs on Admission: I have personally reviewed following labs and  imaging studies  CBC: Recent Labs  Lab 05/01/20 1456  WBC 3.3*  HGB 16.8  HCT 49.6  MCV 85.8  PLT 131*   Basic Metabolic Panel: Recent Labs  Lab 05/01/20 1456 05/01/20 1531  NA 132* 129*  K 3.8 3.7  CL 94* 95*  CO2 25 25  GLUCOSE 135* 132*  BUN 8 8  CREATININE 1.44* 1.41*  CALCIUM 8.8* 8.3*    GFR: CrCl cannot be calculated (Unknown ideal weight.). Liver Function Tests: Recent Labs  Lab 05/01/20 1531  AST 181*  ALT 104*  ALKPHOS 52  BILITOT 0.8  PROT 6.5  ALBUMIN 3.1*   No results for input(s): LIPASE, AMYLASE in the last 168 hours. No results for input(s): AMMONIA in the last 168 hours. Coagulation Profile: No results for input(s): INR, PROTIME in the last 168 hours. Cardiac Enzymes: No results for input(s): CKTOTAL, CKMB, CKMBINDEX, TROPONINI in the last 168 hours. BNP (last 3 results) No results for input(s): PROBNP in the last 8760 hours. HbA1C: No results for input(s): HGBA1C in the last 72 hours. CBG: No results for input(s): GLUCAP in the last 168 hours. Lipid Profile: Recent Labs    05/01/20 1531  TRIG 150*   Thyroid Function Tests: No results for input(s): TSH, T4TOTAL, FREET4, T3FREE, THYROIDAB in the last 72 hours. Anemia Panel: Recent Labs    05/01/20 1531  FERRITIN 3,111*   Urine analysis: No results found for: COLORURINE, APPEARANCEUR, LABSPEC, PHURINE, GLUCOSEU, HGBUR, BILIRUBINUR, KETONESUR, PROTEINUR, UROBILINOGEN, NITRITE, LEUKOCYTESUR  Radiological Exams on Admission: DG Chest Portable 1 View  Result Date: 05/01/2020 CLINICAL DATA:  Hypoxia. EXAM: PORTABLE CHEST 1 VIEW COMPARISON:  None. FINDINGS: The heart size and mediastinal contours are within normal limits. No pneumothorax or pleural effusion is noted. Bilateral patchy airspace opacities are noted concerning for multifocal pneumonia, possibly due to COVID-19. The visualized skeletal structures are unremarkable. IMPRESSION: Bilateral patchy airspace opacities are noted concerning for multifocal pneumonia, possibly due to COVID-19. Electronically Signed   By: Lupita Raider M.D.   On: 05/01/2020 15:18    EKG: Independently reviewed.  Sinus tachycardia.  No ST elevation or depression noted.  Assessment/Plan Principal Problem:   Acute hypoxemic respiratory failure due to COVID-19  [facility]) Active Problems:   Leukopenia   Thrombocytopenia (HCC)   Elevated liver enzymes   Hyponatremia   Severe sepsis and acute hypoxemic respiratory failure due to COVID-19 pneumonia: -Patient presented with fever of 103.3, tachycardia, tachypnea, hypoxia initially requiring nonrebreather-currently on 4-5 L of oxygen via nasal cannula.  Tested positive for COVID-19 on Sunday.  Repeat COVID-19 test is pending.  Reviewed chest x-ray. -Admit patient stepdown unit for close monitoring.  On continuous pulse ox.  We will try to wean off of oxygen as tolerated. -Start patient on remdesivir and Solu-Medrol.  Inflammatory markers elevated.  D-dimer: 1.82, CRP: 6.6, blood culture, procalcitonin level: Pending. -Repeat inflammatory markers tomorrow AM -Albuterol every 6 hours as needed, p.o. vitamins and antitussive as needed. -Patient was told that if COVID-19 pneumonitis gets worse we might potentially use Actemra off label, he denies known history of tuberculosis or hepatitis and wants to proceed with Actemra if required. -Monitor vitals closely.  Leukopenia/thrombocytopenia: Likely secondary to COVID-19 infection. -Repeat CBC tomorrow AM.  Elevated liver enzymes: ALT: 104, AST: 181 Likely in the setting of COVID-19 infection -Monitor liver function.  -We will get right upper quadrant ultrasound  Hyponatremia: -Likely in the setting of diarrhea.  Patient is asymptomatic.  Repeat BMP tomorrow a.m.  DVT prophylaxis: Lovenox/SCD Code Status: Full code Family Communication: None present at bedside.  Plan of care discussed with patient in length and he verbalized understanding and agreed with it. Disposition Plan: Home in 3 to 4 days Consults called: None Admission status: Inpatient   Ollen Bowl MD [facility]  If 7PM-7AM, please contact night-coverage [email or web address] Password [facility]  05/01/2020, 6:04 PM

## 2020-05-02 DIAGNOSIS — E871 Hypo-osmolality and hyponatremia: Secondary | ICD-10-CM

## 2020-05-02 DIAGNOSIS — R748 Abnormal levels of other serum enzymes: Secondary | ICD-10-CM

## 2020-05-02 DIAGNOSIS — D696 Thrombocytopenia, unspecified: Secondary | ICD-10-CM

## 2020-05-02 DIAGNOSIS — D72819 Decreased white blood cell count, unspecified: Secondary | ICD-10-CM

## 2020-05-02 LAB — COMPREHENSIVE METABOLIC PANEL
ALT: 130 U/L — ABNORMAL HIGH (ref 0–44)
AST: 219 U/L — ABNORMAL HIGH (ref 15–41)
Albumin: 3.1 g/dL — ABNORMAL LOW (ref 3.5–5.0)
Alkaline Phosphatase: 52 U/L (ref 38–126)
Anion gap: 10 (ref 5–15)
BUN: 10 mg/dL (ref 6–20)
CO2: 28 mmol/L (ref 22–32)
Calcium: 8.7 mg/dL — ABNORMAL LOW (ref 8.9–10.3)
Chloride: 95 mmol/L — ABNORMAL LOW (ref 98–111)
Creatinine, Ser: 1.53 mg/dL — ABNORMAL HIGH (ref 0.61–1.24)
GFR calc Af Amer: >60 mL/min (ref >60)
GFR calc non Af Amer: 59 mL/min — ABNORMAL LOW (ref >60)
Glucose, Bld: 158 mg/dL — ABNORMAL HIGH (ref 70–99)
Potassium: 4.2 mmol/L (ref 3.5–5.1)
Sodium: 133 mmol/L — ABNORMAL LOW (ref 135–145)
Total Bilirubin: 0.7 mg/dL (ref 0.3–1.2)
Total Protein: 6.9 g/dL (ref 6.5–8.1)

## 2020-05-02 LAB — CBC WITH DIFFERENTIAL/PLATELET
Abs Immature Granulocytes: 0 10*3/uL (ref 0.00–0.07)
Basophils Absolute: 0 10*3/uL (ref 0.0–0.1)
Basophils Relative: 0 %
Eosinophils Absolute: 0 10*3/uL (ref 0.0–0.5)
Eosinophils Relative: 0 %
HCT: 50.2 % (ref 39.0–52.0)
Hemoglobin: 16.9 g/dL (ref 13.0–17.0)
Lymphocytes Relative: 11 %
Lymphs Abs: 0.3 10*3/uL — ABNORMAL LOW (ref 0.7–4.0)
MCH: 29.7 pg (ref 26.0–34.0)
MCHC: 33.7 g/dL (ref 30.0–36.0)
MCV: 88.2 fL (ref 80.0–100.0)
Monocytes Absolute: 0.1 10*3/uL (ref 0.1–1.0)
Monocytes Relative: 5 %
Neutro Abs: 2.2 10*3/uL (ref 1.7–7.7)
Neutrophils Relative %: 84 %
Platelets: 147 10*3/uL — ABNORMAL LOW (ref 150–400)
RBC: 5.69 MIL/uL (ref 4.22–5.81)
RDW: 12.1 % (ref 11.5–15.5)
WBC: 2.6 10*3/uL — ABNORMAL LOW (ref 4.0–10.5)
nRBC: 0 % (ref 0.0–0.2)
nRBC: 0 /100 WBC

## 2020-05-02 LAB — CBG MONITORING, ED
Glucose-Capillary: 127 mg/dL — ABNORMAL HIGH (ref 70–99)
Glucose-Capillary: 152 mg/dL — ABNORMAL HIGH (ref 70–99)
Glucose-Capillary: 116 mg/dL — ABNORMAL HIGH (ref 70–99)
Glucose-Capillary: 117 mg/dL — ABNORMAL HIGH (ref 70–99)

## 2020-05-02 LAB — D-DIMER, QUANTITATIVE: D-Dimer, Quant: 1.45 ug/mL-FEU — ABNORMAL HIGH (ref 0.00–0.50)

## 2020-05-02 LAB — PHOSPHORUS: Phosphorus: 4.7 mg/dL — ABNORMAL HIGH (ref 2.5–4.6)

## 2020-05-02 LAB — C-REACTIVE PROTEIN: CRP: 7.1 mg/dL — ABNORMAL HIGH (ref <1.0)

## 2020-05-02 LAB — MAGNESIUM: Magnesium: 2.3 mg/dL (ref 1.7–2.4)

## 2020-05-02 LAB — FERRITIN: Ferritin: 2998 ng/mL — ABNORMAL HIGH (ref 24–336)

## 2020-05-02 MED ORDER — SODIUM CHLORIDE 0.9 % IV SOLN
100.0000 mg | Freq: Every day | INTRAVENOUS | Status: AC
  Administered 2020-05-02 – 2020-05-05 (×4): 100 mg via INTRAVENOUS
  Filled 2020-05-02: qty 20
  Filled 2020-05-02 (×2): qty 100
  Filled 2020-05-02 (×2): qty 20

## 2020-05-02 MED ORDER — INSULIN ASPART 100 UNIT/ML ~~LOC~~ SOLN
0.0000 [IU] | Freq: Every day | SUBCUTANEOUS | Status: DC
  Administered 2020-05-03: 2 [IU] via SUBCUTANEOUS

## 2020-05-02 MED ORDER — METHYLPREDNISOLONE SODIUM SUCC 125 MG IJ SOLR
80.0000 mg | Freq: Two times a day (BID) | INTRAMUSCULAR | Status: DC
  Administered 2020-05-02 – 2020-05-04 (×5): 80 mg via INTRAVENOUS
  Filled 2020-05-02 (×6): qty 2

## 2020-05-02 MED ORDER — INSULIN ASPART 100 UNIT/ML ~~LOC~~ SOLN
0.0000 [IU] | Freq: Three times a day (TID) | SUBCUTANEOUS | Status: DC
  Administered 2020-05-02: 1 [IU] via SUBCUTANEOUS
  Administered 2020-05-02 – 2020-05-03 (×2): 2 [IU] via SUBCUTANEOUS
  Administered 2020-05-03: 1 [IU] via SUBCUTANEOUS
  Administered 2020-05-03 – 2020-05-04 (×4): 2 [IU] via SUBCUTANEOUS
  Administered 2020-05-05: 1 [IU] via SUBCUTANEOUS

## 2020-05-02 MED ORDER — ENOXAPARIN SODIUM 40 MG/0.4ML ~~LOC~~ SOLN
40.0000 mg | Freq: Two times a day (BID) | SUBCUTANEOUS | Status: DC
  Administered 2020-05-02 – 2020-05-05 (×7): 40 mg via SUBCUTANEOUS
  Filled 2020-05-02 (×7): qty 0.4

## 2020-05-02 NOTE — ED Notes (Signed)
Pt appears to be resting comfortably on hospital bed.  Pt is requesting notepaper, gave pt some printer paper and a pen.  Also brought pt some water and sprite, urinal emptied.  Pt is entertaining himself on his phone, no distress at this time.

## 2020-05-02 NOTE — Progress Notes (Signed)
PROGRESS NOTE  Andre Carey  XMD:[id number] DOB: 10/11/1985 DOA: 05/01/2020 PCP: Patient, No Pcp Per   Brief Narrative: Andre Carey is a 34 y.o. male with no significant medical history who developed flu like illness symptoms 1 week prior and tested positive at home for covid-19 on 9/5 who presented to the ED 9/7 with worsening dyspnea. He was febrile to 103.34F, tachycardic, tachypneic, and hypoxic requiring NRB ultimately weaned to nasal cannula. CXR demonstrated multifocal pneumonia, CRP 6.6, so he was admitted for treatment of covid-19 pneumonia.   Assessment & Plan: Principal Problem:   Acute hypoxemic respiratory failure due to COVID-19 [facility]) Active Problems:   Leukopenia   Thrombocytopenia (HCC)   Elevated liver enzymes   Hyponatremia  Severe sepsis and acute hypoxemic respiratory failure due to covid-19 pneumonia: SARS-CoV-2 Ag reportedly positive on 9/5 initially. Not vaccinated.  - Wean oxygen as tolerated. Appears to be stabilizing, can admit to floor to clear out ED congestion. - Continue remdesivir x5 days (9/7 - 9/11) - Continue steroids, augment dosing today given stubborn CRP elevation and hypoxemia. Note PCT 0.45, will monitor cultures.  - Discussed rationale for using JAK2/IL6 inhibitors were hypoxemia to progress. Will hold for now, though pt consents to their use and does not have any exclusion criteria.  - Encourage OOB, IS, FV, and awake proning if able - Continue airborne, contact precautions for 21 days from positive testing. - Monitor CMP and inflammatory markers - Enoxaparin intermediate dose.  - Encouraged to get vaccine after isolation period  Fasting hyperglycemia:  - Check HbA1c, add SSI empirically to maintain euglycemia.  Leukopenia, thrombocytopenia: Due to viral illness.  - Monitor  Elevated LFTs: Due to covid-19 infection. RUQ U/S unremarkable.   Hyponatremia:  - Monitor  DVT prophylaxis: Lovenox Code Status: Full Family Communication:  Did not want me to call anyone. Disposition Plan:  Status is: Inpatient, can admit to med-surg.  Remains inpatient appropriate because:Inpatient level of care appropriate due to severity of illness   Dispo: The patient is from: Home              Anticipated d/c is to: Home              Anticipated d/c date is: 2 days              Patient currently is not medically stable to d/c.  Consultants:   None  Procedures:   None  Antimicrobials:  Remdesivir 9/7 - 9/11   Subjective: Shortness of breath improved, still coughing, no chest pain or leg swelling. Feels tired.   Objective: Vitals:   05/02/20 1200 05/02/20 1500 05/02/20 1600 05/02/20 1643  BP: 118/79 103/69 (!) 124/95 121/78  Pulse: 85 89 90 82  Resp: (!) 41 (!) 41 (!) 34 (!) 28  Temp:      TempSrc:      SpO2: 96% 94% 93% 100%  Weight:      Height:       No intake or output data in the 24 hours ending 05/02/20 1652 Filed Weights   05/01/20 1800  Weight: 86.4 kg    Gen: 34 y.o. male in no distress Pulm: Non-labored but tachypneic with diffuse crackles. No wheezes.  CV: Regular rate and rhythm. No murmur, rub, or gallop. No JVD, no pedal edema. GI: Abdomen soft, non-tender, non-distended, with normoactive bowel sounds. No organomegaly or masses felt. Ext: Warm, no deformities Skin: No rashes, lesions or ulcers Neuro: Alert and oriented. No focal neurological deficits. Psych: Judgement  and insight appear normal. Mood & affect appropriate.   Data Reviewed: I have personally reviewed following labs and imaging studies  CBC: Recent Labs  Lab 05/01/20 1456 05/02/20 0428  WBC 3.3* 2.6*  NEUTROABS  --  2.2  HGB 16.8 16.9  HCT 49.6 50.2  MCV 85.8 88.2  PLT 131* 147*   Basic Metabolic Panel: Recent Labs  Lab 05/01/20 1456 05/01/20 1531 05/02/20 0428  NA 132* 129* 133*  K 3.8 3.7 4.2  CL 94* 95* 95*  CO2 25 25 28   GLUCOSE 135* 132* 158*  BUN 8 8 10   CREATININE 1.44* 1.41* 1.53*  CALCIUM 8.8* 8.3*  8.7*  MG  --   --  2.3  PHOS  --   --  4.7*   GFR: Estimated Creatinine Clearance: 74.1 mL/min (A) (by C-G formula based on SCr of 1.53 mg/dL (H)). Liver Function Tests: Recent Labs  Lab 05/01/20 1531 05/02/20 0428  AST 181* 219*  ALT 104* 130*  ALKPHOS 52 52  BILITOT 0.8 0.7  PROT 6.5 6.9  ALBUMIN 3.1* 3.1*   No results for input(s): LIPASE, AMYLASE in the last 168 hours. No results for input(s): AMMONIA in the last 168 hours. Coagulation Profile: No results for input(s): INR, PROTIME in the last 168 hours. Cardiac Enzymes: No results for input(s): CKTOTAL, CKMB, CKMBINDEX, TROPONINI in the last 168 hours. BNP (last 3 results) No results for input(s): PROBNP in the last 8760 hours. HbA1C: No results for input(s): HGBA1C in the last 72 hours. CBG: Recent Labs  Lab 05/02/20 0803 05/02/20 1208  GLUCAP 127* 152*   Lipid Profile: Recent Labs    05/01/20 1531  TRIG 150*   Thyroid Function Tests: No results for input(s): TSH, T4TOTAL, FREET4, T3FREE, THYROIDAB in the last 72 hours. Anemia Panel: Recent Labs    05/01/20 1531 05/02/20 0428  FERRITIN 3,111* 2,998*   Urine analysis: No results found for: COLORURINE, APPEARANCEUR, LABSPEC, PHURINE, GLUCOSEU, HGBUR, BILIRUBINUR, KETONESUR, PROTEINUR, UROBILINOGEN, NITRITE, LEUKOCYTESUR Recent Results (from the past 240 hour(s))  Blood Culture (routine x 2)     Status: None (Preliminary result)   Collection Time: 05/01/20  3:30 PM   Specimen: BLOOD  Result Value Ref Range Status   Specimen Description BLOOD BLOOD RIGHT FOREARM  Final   Special Requests   Final    BOTTLES DRAWN AEROBIC AND ANAEROBIC Blood Culture adequate volume   Culture   Final    NO GROWTH < 24 HOURS Performed at [facility], [address] [place], [address] [place]    Report Status PENDING  Incomplete  SARS Coronavirus 2 by RT PCR (hospital order, performed in [facility] hospital lab) Nasopharyngeal Nasopharyngeal Swab     Status:  Abnormal   Collection Time: 05/01/20  3:31 PM   Specimen: Nasopharyngeal Swab  Result Value Ref Range Status   SARS Coronavirus 2 POSITIVE (A) NEGATIVE Final    Comment: RESULT CALLED TO, READ BACK BY AND VERIFIED WITH: L SHULAR RN 05/01/20 AT 1809  (NOTE) SARS-CoV-2 target nucleic acids are DETECTED  SARS-CoV-2 RNA is generally detectable in upper respiratory specimens  during the acute phase of infection.  Positive results are indicative  of the presence of the identified virus, but do not rule out bacterial infection or co-infection with other pathogens not detected by the test.  Clinical correlation with patient history and  other diagnostic information is necessary to determine patient infection status.  The expected result is negative.  Fact Sheet for  Patients:   BoilerBrush.com.cy   Fact Sheet for Healthcare Providers:   https://pope.com/    This test is not yet approved or cleared by the [place] FDA and  has been authorized for detection and/or diagnosis of SARS-CoV-2 by FDA under an Emergency Use Authorization (EUA).  This EUA will remain in effect (meaning this test ca n be used) for the duration of  the COVID-19 declaration under Section 564(b)(1) of the Act, 21 U.S.C. section 360-bbb-3(b)(1), unless the authorization is terminated or revoked sooner.  Performed at [facility], [address] [place], [place] [address]   Blood Culture (routine x 2)     Status: None (Preliminary result)   Collection Time: 05/01/20  3:36 PM   Specimen: BLOOD  Result Value Ref Range Status   Specimen Description BLOOD SITE NOT SPECIFIED  Final   Special Requests   Final    BOTTLES DRAWN AEROBIC AND ANAEROBIC Blood Culture results may not be optimal due to an inadequate volume of blood received in culture bottles   Culture   Final    NO GROWTH < 24 HOURS Performed at [facility], [address] [place], [place]  [address]    Report Status PENDING  Incomplete      Radiology Studies: DG Chest Portable 1 View  Result Date: 05/01/2020 CLINICAL DATA:  Hypoxia. EXAM: PORTABLE CHEST 1 VIEW COMPARISON:  None. FINDINGS: The heart size and mediastinal contours are within normal limits. No pneumothorax or pleural effusion is noted. Bilateral patchy airspace opacities are noted concerning for multifocal pneumonia, possibly due to COVID-19. The visualized skeletal structures are unremarkable. IMPRESSION: Bilateral patchy airspace opacities are noted concerning for multifocal pneumonia, possibly due to COVID-19. Electronically Signed   By: Lupita Raider M.D.   On: 05/01/2020 15:18   US Abdomen Limited RUQ  Result Date: 05/01/2020 CLINICAL DATA:  Elevated liver function tests EXAM: ULTRASOUND ABDOMEN LIMITED RIGHT UPPER QUADRANT COMPARISON:  None. FINDINGS: Gallbladder: No gallstones or wall thickening visualized. No sonographic Murphy sign noted by sonographer. Common bile duct: Diameter: 3 mm Liver: No focal lesion identified. Within normal limits in parenchymal echogenicity. Portal vein is patent on color Doppler imaging with normal direction of blood flow towards the liver. Other: None. IMPRESSION: 1. Unremarkable right upper quadrant ultrasound. Electronically Signed   By: Sharlet Salina M.D.   On: 05/01/2020 19:24    Scheduled Meds: . albuterol  2 puff Inhalation Q6H  . vitamin C  500 mg Oral Daily  . enoxaparin (LOVENOX) injection  40 mg Subcutaneous Q12H  . insulin aspart  0-5 Units Subcutaneous QHS  . insulin aspart  0-9 Units Subcutaneous TID WC  . methylPREDNISolone (SOLU-MEDROL) injection  80 mg Intravenous Q12H  . zinc sulfate  220 mg Oral Daily   Continuous Infusions: . remdesivir 100 mg in NS 100 mL 100 mg (05/02/20 1525)     LOS: 1 day   Time spent: 35 minutes.  Tyrone Nine, MD [facility] [email or web address] 05/02/2020, 4:52 PM

## 2020-05-03 ENCOUNTER — Encounter (HOSPITAL_COMMUNITY): Payer: Self-pay | Admitting: Family Medicine

## 2020-05-03 ENCOUNTER — Other Ambulatory Visit: Payer: Self-pay

## 2020-05-03 LAB — CBC WITH DIFFERENTIAL/PLATELET
Abs Immature Granulocytes: 0.19 10*3/uL — ABNORMAL HIGH (ref 0.00–0.07)
Basophils Absolute: 0 10*3/uL (ref 0.0–0.1)
Basophils Relative: 0 %
Eosinophils Absolute: 0 10*3/uL (ref 0.0–0.5)
Eosinophils Relative: 0 %
HCT: 47.5 % (ref 39.0–52.0)
Hemoglobin: 16.4 g/dL (ref 13.0–17.0)
Immature Granulocytes: 2 %
Lymphocytes Relative: 10 %
Lymphs Abs: 0.8 10*3/uL (ref 0.7–4.0)
MCH: 30 pg (ref 26.0–34.0)
MCHC: 34.5 g/dL (ref 30.0–36.0)
MCV: 86.8 fL (ref 80.0–100.0)
Monocytes Absolute: 0.9 10*3/uL (ref 0.1–1.0)
Monocytes Relative: 11 %
Neutro Abs: 6.2 10*3/uL (ref 1.7–7.7)
Neutrophils Relative %: 77 %
Platelets: 212 10*3/uL (ref 150–400)
RBC: 5.47 MIL/uL (ref 4.22–5.81)
RDW: 12.1 % (ref 11.5–15.5)
WBC: 8.1 10*3/uL (ref 4.0–10.5)
nRBC: 0.2 % (ref 0.0–0.2)

## 2020-05-03 LAB — COMPREHENSIVE METABOLIC PANEL
ALT: 145 U/L — ABNORMAL HIGH (ref 0–44)
AST: 167 U/L — ABNORMAL HIGH (ref 15–41)
Albumin: 2.8 g/dL — ABNORMAL LOW (ref 3.5–5.0)
Alkaline Phosphatase: 52 U/L (ref 38–126)
Anion gap: 9 (ref 5–15)
BUN: 15 mg/dL (ref 6–20)
CO2: 30 mmol/L (ref 22–32)
Calcium: 8.5 mg/dL — ABNORMAL LOW (ref 8.9–10.3)
Chloride: 98 mmol/L (ref 98–111)
Creatinine, Ser: 1.36 mg/dL — ABNORMAL HIGH (ref 0.61–1.24)
GFR calc Af Amer: >60 mL/min (ref >60)
GFR calc non Af Amer: >60 mL/min (ref >60)
Glucose, Bld: 142 mg/dL — ABNORMAL HIGH (ref 70–99)
Potassium: 4 mmol/L (ref 3.5–5.1)
Sodium: 137 mmol/L (ref 135–145)
Total Bilirubin: 0.9 mg/dL (ref 0.3–1.2)
Total Protein: 6.3 g/dL — ABNORMAL LOW (ref 6.5–8.1)

## 2020-05-03 LAB — GLUCOSE, CAPILLARY
Glucose-Capillary: 164 mg/dL — ABNORMAL HIGH (ref 70–99)
Glucose-Capillary: 130 mg/dL — ABNORMAL HIGH (ref 70–99)
Glucose-Capillary: 161 mg/dL — ABNORMAL HIGH (ref 70–99)
Glucose-Capillary: 202 mg/dL — ABNORMAL HIGH (ref 70–99)

## 2020-05-03 LAB — D-DIMER, QUANTITATIVE: D-Dimer, Quant: 0.75 ug/mL-FEU — ABNORMAL HIGH (ref 0.00–0.50)

## 2020-05-03 LAB — HEMOGLOBIN A1C
Hgb A1c MFr Bld: 5.8 % — ABNORMAL HIGH (ref 4.8–5.6)
Mean Plasma Glucose: 119.76 mg/dL

## 2020-05-03 LAB — C-REACTIVE PROTEIN: CRP: 2.1 mg/dL — ABNORMAL HIGH (ref <1.0)

## 2020-05-03 NOTE — Progress Notes (Signed)
   05/03/20 0349  Assess: MEWS Score  Level of Consciousness Alert  Assess: MEWS Score  MEWS Temp 0  MEWS Systolic 0  MEWS Pulse 0  MEWS RR 2  MEWS LOC 0  MEWS Score 2  MEWS Score Color Yellow  Assess: if the MEWS score is Yellow or Red  Were vital signs taken at a resting state? Yes  Focused Assessment No change from prior assessment  MEWS guidelines implemented *See Row Information* Yes  Treat  Pain Scale 0-10  Pain Score 0  Patients Stated Pain Goal 0  Take Vital Signs  Increase Vital Sign Frequency  Yellow: Q 2hr X 2 then Q 4hr X 2, if remains yellow, continue Q 4hrs  Notify: Charge Nurse/RN  Name of Charge Nurse/RN Notified  [place]  Date Charge Nurse/RN Notified 05/03/20  Time Charge Nurse/RN Notified 0400  Document  Patient Outcome Stabilized after interventions  Progress note created (see row info) Yes

## 2020-05-03 NOTE — Progress Notes (Signed)
PROGRESS NOTE  Haron Beilke  GYK:[id number] DOB: Jul 27, 1986 DOA: 05/01/2020 PCP: Patient, No Pcp Per   Brief Narrative: Hisashi Amadon is a 34 y.o. male with no significant medical history who developed flu like illness symptoms 1 week prior and tested positive at home for covid-19 on 9/5 who presented to the ED 9/7 with worsening dyspnea. He was febrile to 103.61F, tachycardic, tachypneic, and hypoxic requiring NRB ultimately weaned to nasal cannula. CXR demonstrated multifocal pneumonia, CRP 6.6, so he was admitted for treatment of covid-19 pneumonia.   Assessment & Plan: Principal Problem:   Acute hypoxemic respiratory failure due to COVID-19 [facility]) Active Problems:   Leukopenia   Thrombocytopenia (HCC)   Elevated liver enzymes   Hyponatremia  Severe sepsis and acute hypoxemic respiratory failure due to covid-19 pneumonia: SARS-CoV-2 Ag reportedly positive on 9/5 initially. Not vaccinated.  - Wean oxygen as tolerated. Continue OOB. Needs incentive spirometry, messaged RN and confirmed order had been placed for this and FV.  - Continue remdesivir x5 days (9/7 - 9/11) - Continue steroids, plan taper 9/10 if CRP diminution continues and remains clinically stable-to-improving. Note PCT 0.45, will monitor cultures.  - Discussed rationale for using JAK2/IL6 inhibitors were hypoxemia to progress. Will hold for now, though pt consents to their use and does not have any exclusion criteria.  - Continue airborne, contact precautions for 21 days from positive testing. - Monitor CMP and inflammatory markers - Enoxaparin intermediate dose.  - Encouraged to get vaccine after isolation period  Fasting hyperglycemia, prediabetes: HbA1c 5.8%.  - Continue sensitive SSI to keep tight control.  Leukopenia, thrombocytopenia: Due to viral illness. Resolved.  Elevated LFTs: Due to covid-19 infection. RUQ U/S unremarkable.  - Showing evidence of improvement/stability. Will continue  monitoring.  Hyponatremia: Resolved.  DVT prophylaxis: Lovenox Code Status: Full Family Communication: Did not want me to call anyone. Had facetime on during encounter. Disposition Plan:  Status is: Inpatient, can admit to med-surg.  Remains inpatient appropriate because:Inpatient level of care appropriate due to severity of illness   Dispo: The patient is from: Home              Anticipated d/c is to: Home              Anticipated d/c date is: 2 days              Patient currently is not medically stable to d/c.  Consultants:   None  Procedures:   None  Antimicrobials:  Remdesivir 9/7 - 9/11   Subjective: Coughs when he takes a deep breath, so tried not to. Short of breath only moderately and only with exertion, improved with oxygen and rest.   Objective: Vitals:   05/03/20 0600 05/03/20 0742 05/03/20 1052 05/03/20 1200  BP: 124/80 124/80 117/87   Pulse: 77 84 80 92  Resp: (!) 21 (!) 23 (!) 22 20  Temp: 98.1 F (36.7 C) (!) 97.5 F (36.4 C) 98.6 F (37 C)   TempSrc: Oral Axillary Oral   SpO2: 93% 92% 92% 94%  Weight: 84.9 kg     Height:       No intake or output data in the 24 hours ending 05/03/20 1331 Filed Weights   05/01/20 1800 05/03/20 0600  Weight: 86.4 kg 84.9 kg   Gen: 34 y.o. male in no distress Pulm: Nonlabored at rest breathing 4L O2, crackles barely audible, but greatly diminished throughout. CV: Regular rate and rhythm. No murmur, rub, or gallop. No JVD, no dependent  edema. GI: Abdomen soft, non-tender, non-distended, with normoactive bowel sounds.  Ext: Warm, no deformities Skin: No rashes, lesions or ulcers on visualized skin. Neuro: Alert and oriented. No focal neurological deficits. Psych: Judgement and insight appear fair. Mood euthymic & affect congruent. Behavior is appropriate.    Data Reviewed: I have personally reviewed following labs and imaging studies  CBC: Recent Labs  Lab 05/01/20 1456 05/02/20 0428 05/03/20 0949  WBC  3.3* 2.6* 8.1  NEUTROABS  --  2.2 6.2  HGB 16.8 16.9 16.4  HCT 49.6 50.2 47.5  MCV 85.8 88.2 86.8  PLT 131* 147* 212   Basic Metabolic Panel: Recent Labs  Lab 05/01/20 1456 05/01/20 1531 05/02/20 0428 05/03/20 0949  NA 132* 129* 133* 137  K 3.8 3.7 4.2 4.0  CL 94* 95* 95* 98  CO2 25 25 28 30   GLUCOSE 135* 132* 158* 142*  BUN 8 8 10 15   CREATININE 1.44* 1.41* 1.53* 1.36*  CALCIUM 8.8* 8.3* 8.7* 8.5*  MG  --   --  2.3  --   PHOS  --   --  4.7*  --    GFR: Estimated Creatinine Clearance: 82.7 mL/min (A) (by C-G formula based on SCr of 1.36 mg/dL (H)). Liver Function Tests: Recent Labs  Lab 05/01/20 1531 05/02/20 0428 05/03/20 0949  AST 181* 219* 167*  ALT 104* 130* 145*  ALKPHOS 52 52 52  BILITOT 0.8 0.7 0.9  PROT 6.5 6.9 6.3*  ALBUMIN 3.1* 3.1* 2.8*   No results for input(s): LIPASE, AMYLASE in the last 168 hours. No results for input(s): AMMONIA in the last 168 hours. Coagulation Profile: No results for input(s): INR, PROTIME in the last 168 hours. Cardiac Enzymes: No results for input(s): CKTOTAL, CKMB, CKMBINDEX, TROPONINI in the last 168 hours. BNP (last 3 results) No results for input(s): PROBNP in the last 8760 hours. HbA1C: Recent Labs    05/03/20 0949  HGBA1C 5.8*   CBG: Recent Labs  Lab 05/02/20 1208 05/02/20 1701 05/02/20 2137 05/03/20 0739 05/03/20 1051  GLUCAP 152* 116* 117* 164* 130*   Lipid Profile: Recent Labs    05/01/20 1531  TRIG 150*   Thyroid Function Tests: No results for input(s): TSH, T4TOTAL, FREET4, T3FREE, THYROIDAB in the last 72 hours. Anemia Panel: Recent Labs    05/01/20 1531 05/02/20 0428  FERRITIN 3,111* 2,998*   Urine analysis: No results found for: COLORURINE, APPEARANCEUR, LABSPEC, PHURINE, GLUCOSEU, HGBUR, BILIRUBINUR, KETONESUR, PROTEINUR, UROBILINOGEN, NITRITE, LEUKOCYTESUR Recent Results (from the past 240 hour(s))  Blood Culture (routine x 2)     Status: None (Preliminary result)   Collection  Time: 05/01/20  3:30 PM   Specimen: BLOOD  Result Value Ref Range Status   Specimen Description BLOOD BLOOD RIGHT FOREARM  Final   Special Requests   Final    BOTTLES DRAWN AEROBIC AND ANAEROBIC Blood Culture adequate volume   Culture   Final    NO GROWTH 2 DAYS Performed at [facility] [place], [address] [place], [address] [place]    Report Status PENDING  Incomplete  SARS Coronavirus 2 by RT PCR (hospital order, performed in [facility] hospital lab) Nasopharyngeal Nasopharyngeal Swab     Status: Abnormal   Collection Time: 05/01/20  3:31 PM   Specimen: Nasopharyngeal Swab  Result Value Ref Range Status   SARS Coronavirus 2 POSITIVE (A) NEGATIVE Final    Comment: RESULT CALLED TO, READ BACK BY AND VERIFIED WITH: L SHULAR RN 05/01/20 AT 1809  (NOTE)  SARS-CoV-2 target nucleic acids are DETECTED  SARS-CoV-2 RNA is generally detectable in upper respiratory specimens  during the acute phase of infection.  Positive results are indicative  of the presence of the identified virus, but do not rule out bacterial infection or co-infection with other pathogens not detected by the test.  Clinical correlation with patient history and  other diagnostic information is necessary to determine patient infection status.  The expected result is negative.  Fact Sheet for Patients:   BoilerBrush.com.cy   Fact Sheet for Healthcare Providers:   https://pope.com/    This test is not yet approved or cleared by the [place] FDA and  has been authorized for detection and/or diagnosis of SARS-CoV-2 by FDA under an Emergency Use Authorization (EUA).  This EUA will remain in effect (meaning this test ca n be used) for the duration of  the COVID-19 declaration under Section 564(b)(1) of the Act, 21 U.S.C. section 360-bbb-3(b)(1), unless the authorization is terminated or revoked sooner.  Performed at [facility], [address]  [place], [place] [address]   Blood Culture (routine x 2)     Status: None (Preliminary result)   Collection Time: 05/01/20  3:36 PM   Specimen: BLOOD  Result Value Ref Range Status   Specimen Description BLOOD SITE NOT SPECIFIED  Final   Special Requests   Final    BOTTLES DRAWN AEROBIC AND ANAEROBIC Blood Culture results may not be optimal due to an inadequate volume of blood received in culture bottles   Culture   Final    NO GROWTH 2 DAYS Performed at [facility], [address], [place], [place] [address]    Report Status PENDING  Incomplete      Radiology Studies: DG Chest Portable 1 View  Result Date: 05/01/2020 CLINICAL DATA:  Hypoxia. EXAM: PORTABLE CHEST 1 VIEW COMPARISON:  None. FINDINGS: The heart size and mediastinal contours are within normal limits. No pneumothorax or pleural effusion is noted. Bilateral patchy airspace opacities are noted concerning for multifocal pneumonia, possibly due to COVID-19. The visualized skeletal structures are unremarkable. IMPRESSION: Bilateral patchy airspace opacities are noted concerning for multifocal pneumonia, possibly due to COVID-19. Electronically Signed   By: Lupita Raider M.D.   On: 05/01/2020 15:18   US Abdomen Limited RUQ  Result Date: 05/01/2020 CLINICAL DATA:  Elevated liver function tests EXAM: ULTRASOUND ABDOMEN LIMITED RIGHT UPPER QUADRANT COMPARISON:  None. FINDINGS: Gallbladder: No gallstones or wall thickening visualized. No sonographic Murphy sign noted by sonographer. Common bile duct: Diameter: 3 mm Liver: No focal lesion identified. Within normal limits in parenchymal echogenicity. Portal vein is patent on color Doppler imaging with normal direction of blood flow towards the liver. Other: None. IMPRESSION: 1. Unremarkable right upper quadrant ultrasound. Electronically Signed   By: Sharlet Salina M.D.   On: 05/01/2020 19:24    Scheduled Meds: . albuterol  2 puff Inhalation Q6H  . vitamin C  500 mg Oral Daily  .  enoxaparin (LOVENOX) injection  40 mg Subcutaneous Q12H  . insulin aspart  0-5 Units Subcutaneous QHS  . insulin aspart  0-9 Units Subcutaneous TID WC  . methylPREDNISolone (SOLU-MEDROL) injection  80 mg Intravenous Q12H  . zinc sulfate  220 mg Oral Daily   Continuous Infusions: . remdesivir 100 mg in NS 100 mL 100 mg (05/03/20 1044)     LOS: 2 days   Time spent: 25 minutes.  Tyrone Nine, MD [facility] [email or web address] 05/03/2020, 1:31 PM

## 2020-05-03 NOTE — ED Notes (Signed)
Call to 6E to give report 

## 2020-05-04 LAB — COMPREHENSIVE METABOLIC PANEL
ALT: 154 U/L — ABNORMAL HIGH (ref 0–44)
AST: 152 U/L — ABNORMAL HIGH (ref 15–41)
Albumin: 2.8 g/dL — ABNORMAL LOW (ref 3.5–5.0)
Alkaline Phosphatase: 55 U/L (ref 38–126)
Anion gap: 9 (ref 5–15)
BUN: 18 mg/dL (ref 6–20)
CO2: 29 mmol/L (ref 22–32)
Calcium: 8.6 mg/dL — ABNORMAL LOW (ref 8.9–10.3)
Chloride: 99 mmol/L (ref 98–111)
Creatinine, Ser: 1.22 mg/dL (ref 0.61–1.24)
GFR calc Af Amer: >60 mL/min (ref >60)
GFR calc non Af Amer: >60 mL/min (ref >60)
Glucose, Bld: 191 mg/dL — ABNORMAL HIGH (ref 70–99)
Potassium: 4 mmol/L (ref 3.5–5.1)
Sodium: 137 mmol/L (ref 135–145)
Total Bilirubin: 1.1 mg/dL (ref 0.3–1.2)
Total Protein: 5.8 g/dL — ABNORMAL LOW (ref 6.5–8.1)

## 2020-05-04 LAB — D-DIMER, QUANTITATIVE: D-Dimer, Quant: 0.68 ug/mL-FEU — ABNORMAL HIGH (ref 0.00–0.50)

## 2020-05-04 LAB — GLUCOSE, CAPILLARY
Glucose-Capillary: 177 mg/dL — ABNORMAL HIGH (ref 70–99)
Glucose-Capillary: 132 mg/dL — ABNORMAL HIGH (ref 70–99)
Glucose-Capillary: 199 mg/dL — ABNORMAL HIGH (ref 70–99)
Glucose-Capillary: 168 mg/dL — ABNORMAL HIGH (ref 70–99)

## 2020-05-04 LAB — C-REACTIVE PROTEIN: CRP: 1.1 mg/dL — ABNORMAL HIGH (ref <1.0)

## 2020-05-04 MED ORDER — METHYLPREDNISOLONE SODIUM SUCC 125 MG IJ SOLR
60.0000 mg | Freq: Two times a day (BID) | INTRAMUSCULAR | Status: DC
  Administered 2020-05-04 – 2020-05-05 (×2): 60 mg via INTRAVENOUS
  Filled 2020-05-04 (×2): qty 2

## 2020-05-04 MED ORDER — ALBUTEROL SULFATE HFA 108 (90 BASE) MCG/ACT IN AERS
2.0000 | INHALATION_SPRAY | Freq: Three times a day (TID) | RESPIRATORY_TRACT | Status: DC
  Administered 2020-05-04 – 2020-05-05 (×2): 2 via RESPIRATORY_TRACT

## 2020-05-04 NOTE — Progress Notes (Signed)
PROGRESS NOTE  Andre Carey  OFB:[id number] DOB: 02/27/1986 DOA: 05/01/2020 PCP: Patient, No Pcp Per   Brief Narrative: Andre Carey is a 34 y.o. male with no significant medical history who developed flu like illness symptoms 1 week prior and tested positive at home for covid-19 on 9/5 who presented to the ED 9/7 with worsening dyspnea. He was febrile to 103.34F, tachycardic, tachypneic, and hypoxic requiring NRB ultimately weaned to nasal cannula. CXR demonstrated multifocal pneumonia, CRP 6.6, so he was admitted for treatment of covid-19 pneumonia.   Assessment & Plan: Principal Problem:   Acute hypoxemic respiratory failure due to COVID-19 [facility]) Active Problems:   Leukopenia   Thrombocytopenia (HCC)   Elevated liver enzymes   Hyponatremia  Severe sepsis and acute hypoxemic respiratory failure due to covid-19 pneumonia: SARS-CoV-2 Ag reportedly positive on 9/5 initially. Not vaccinated.  - Wean oxygen as tolerated. Continue OOB, IS, FV.  - Continue remdesivir x5 days (9/7 - 9/11) - Continue steroids, will taper today. CRP diminution continues and remains clinically stable-to-improving. Note PCT 0.45, will monitor cultures.  - Discussed rationale for using JAK/IL6 inhibitors were hypoxemia to progress. Will hold for now, though pt consents to their use and does not have any exclusion criteria.  - Continue airborne, contact precautions for 21 days from positive testing. - Monitor CMP and inflammatory markers - Enoxaparin intermediate dose.  - Encouraged to get vaccine after isolation period  Fasting hyperglycemia, prediabetes: HbA1c 5.8%.  - Continue sensitive SSI to keep tight control.  Leukopenia, thrombocytopenia: Due to viral illness. Resolved.  Elevated LFTs: Due to covid-19 infection. RUQ U/S unremarkable.  - Stable/improving. Will continue monitoring 9/11.  Hyponatremia: Resolved.  DVT prophylaxis: Lovenox Code Status: Full Family Communication: Has declined offer to  call. Disposition Plan:  Status is: Inpatient, can admit to med-surg.  Remains inpatient appropriate because:Inpatient level of care appropriate due to severity of illness  Dispo: The patient is from: Home              Anticipated d/c is to: Home              Anticipated d/c date is: 05/05/2020              Patient currently is not medically stable to d/c.  Consultants:   None  Procedures:   None  Antimicrobials:  Remdesivir 9/7 - 9/11   Subjective: Feels fine, still with mild shortness of breath worse with exertion, still on 2L but no dyspnea at rest. He is proning and using IS and FV. Eager to go home.   Objective: Vitals:   05/03/20 1600 05/03/20 2320 05/04/20 0239 05/04/20 0742  BP: 115/74 114/71 124/74 115/67  Pulse: 87 67 64 78  Resp: (!) 22 18 17  (!) 22  Temp: 98.8 F (37.1 C) 97.8 F (36.6 C) 97.7 F (36.5 C) 98.3 F (36.8 C)  TempSrc: Oral Oral Oral Oral  SpO2: 94% 94% 94% 95%  Weight:      Height:        Intake/Output Summary (Last 24 hours) at 05/04/2020 07/04/2020 Last data filed at 05/04/2020 0741 Gross per 24 hour  Intake 460 ml  Output 1000 ml  Net -540 ml   Filed Weights   05/01/20 1800 05/03/20 0600  Weight: 86.4 kg 84.9 kg   Gen: 34 y.o. male in no distress Pulm: Nonlabored breathing 2L O2 at rest, diminished globally with minimal crackles. No wheezes. CV: Regular rate and rhythm. No murmur, rub, or gallop. No JVD,  no dependent edema. GI: Abdomen soft, non-tender, non-distended, with normoactive bowel sounds.  Ext: Warm, no deformities Skin: No rashes, lesions or ulcers on visualized skin. Neuro: Alert and oriented. No focal neurological deficits. Psych: Judgement and insight appear fair. Mood euthymic & affect congruent. Behavior is appropriate.    Data Reviewed: I have personally reviewed following labs and imaging studies  CBC: Recent Labs  Lab 05/01/20 1456 05/02/20 0428 05/03/20 0949  WBC 3.3* 2.6* 8.1  NEUTROABS  --  2.2 6.2  HGB  16.8 16.9 16.4  HCT 49.6 50.2 47.5  MCV 85.8 88.2 86.8  PLT 131* 147* 212   Basic Metabolic Panel: Recent Labs  Lab 05/01/20 1456 05/01/20 1531 05/02/20 0428 05/03/20 0949 05/04/20 0339  NA 132* 129* 133* 137 137  K 3.8 3.7 4.2 4.0 4.0  CL 94* 95* 95* 98 99  CO2 25 25 28 30 29   GLUCOSE 135* 132* 158* 142* 191*  BUN 8 8 10 15 18   CREATININE 1.44* 1.41* 1.53* 1.36* 1.22  CALCIUM 8.8* 8.3* 8.7* 8.5* 8.6*  MG  --   --  2.3  --   --   PHOS  --   --  4.7*  --   --    GFR: Estimated Creatinine Clearance: 92.2 mL/min (by C-G formula based on SCr of 1.22 mg/dL). Liver Function Tests: Recent Labs  Lab 05/01/20 1531 05/02/20 0428 05/03/20 0949 05/04/20 0339  AST 181* 219* 167* 152*  ALT 104* 130* 145* 154*  ALKPHOS 52 52 52 55  BILITOT 0.8 0.7 0.9 1.1  PROT 6.5 6.9 6.3* 5.8*  ALBUMIN 3.1* 3.1* 2.8* 2.8*   HbA1C: Recent Labs    05/03/20 0949  HGBA1C 5.8*   CBG: Recent Labs  Lab 05/03/20 0739 05/03/20 1051 05/03/20 1719 05/03/20 2040 05/04/20 0739  GLUCAP 164* 130* 161* 202* 177*   Lipid Profile: Recent Labs    05/01/20 1531  TRIG 150*   Thyroid Function Tests: No results for input(s): TSH, T4TOTAL, FREET4, T3FREE, THYROIDAB in the last 72 hours. Anemia Panel: Recent Labs    05/01/20 1531 05/02/20 0428  FERRITIN 3,111* 2,998*   Urine analysis: No results found for: COLORURINE, APPEARANCEUR, LABSPEC, PHURINE, GLUCOSEU, HGBUR, BILIRUBINUR, KETONESUR, PROTEINUR, UROBILINOGEN, NITRITE, LEUKOCYTESUR Recent Results (from the past 240 hour(s))  Blood Culture (routine x 2)     Status: None (Preliminary result)   Collection Time: 05/01/20  3:30 PM   Specimen: BLOOD  Result Value Ref Range Status   Specimen Description BLOOD BLOOD RIGHT FOREARM  Final   Special Requests   Final    BOTTLES DRAWN AEROBIC AND ANAEROBIC Blood Culture adequate volume   Culture   Final    NO GROWTH 2 DAYS Performed at [facility], [address], [place], [address]  [place]    Report Status PENDING  Incomplete  SARS Coronavirus 2 by RT PCR (hospital order, performed in [facility] hospital lab) Nasopharyngeal Nasopharyngeal Swab     Status: Abnormal   Collection Time: 05/01/20  3:31 PM   Specimen: Nasopharyngeal Swab  Result Value Ref Range Status   SARS Coronavirus 2 POSITIVE (A) NEGATIVE Final    Comment: RESULT CALLED TO, READ BACK BY AND VERIFIED WITH: L SHULAR RN 05/01/20 AT 1809  (NOTE) SARS-CoV-2 target nucleic acids are DETECTED  SARS-CoV-2 RNA is generally detectable in upper respiratory specimens  during the acute phase of infection.  Positive results are indicative  of the presence of the identified virus, but do not  rule out bacterial infection or co-infection with other pathogens not detected by the test.  Clinical correlation with patient history and  other diagnostic information is necessary to determine patient infection status.  The expected result is negative.  Fact Sheet for Patients:   BoilerBrush.com.cy   Fact Sheet for Healthcare Providers:   https://pope.com/    This test is not yet approved or cleared by the [place] FDA and  has been authorized for detection and/or diagnosis of SARS-CoV-2 by FDA under an Emergency Use Authorization (EUA).  This EUA will remain in effect (meaning this test ca n be used) for the duration of  the COVID-19 declaration under Section 564(b)(1) of the Act, 21 U.S.C. section 360-bbb-3(b)(1), unless the authorization is terminated or revoked sooner.  Performed at [facility], [address] [place], [place] [address]   Blood Culture (routine x 2)     Status: None (Preliminary result)   Collection Time: 05/01/20  3:36 PM   Specimen: BLOOD  Result Value Ref Range Status   Specimen Description BLOOD SITE NOT SPECIFIED  Final   Special Requests   Final    BOTTLES DRAWN AEROBIC AND ANAEROBIC Blood Culture results may not be optimal  due to an inadequate volume of blood received in culture bottles   Culture   Final    NO GROWTH 2 DAYS Performed at [facility], [address] [place], [place] [address]    Report Status PENDING  Incomplete      Radiology Studies: No results found.  Scheduled Meds: . albuterol  2 puff Inhalation Q6H  . vitamin C  500 mg Oral Daily  . enoxaparin (LOVENOX) injection  40 mg Subcutaneous Q12H  . insulin aspart  0-5 Units Subcutaneous QHS  . insulin aspart  0-9 Units Subcutaneous TID WC  . methylPREDNISolone (SOLU-MEDROL) injection  80 mg Intravenous Q12H  . zinc sulfate  220 mg Oral Daily   Continuous Infusions: . remdesivir 100 mg in NS 100 mL 100 mg (05/04/20 0816)     LOS: 3 days   Time spent: 25 minutes.  Tyrone Nine, MD [facility] [email or web address] 05/04/2020, 8:33 AM

## 2020-05-04 NOTE — Progress Notes (Signed)
Pt disconnected his Telemetry , SpO2 chords and found to be relaxing in his room. Educated him on the importance of having them on for monitoring purposes. Pt verbalizes the understanding.

## 2020-05-05 LAB — COMPREHENSIVE METABOLIC PANEL
ALT: 219 U/L — ABNORMAL HIGH (ref 0–44)
AST: 175 U/L — ABNORMAL HIGH (ref 15–41)
Albumin: 2.7 g/dL — ABNORMAL LOW (ref 3.5–5.0)
Alkaline Phosphatase: 55 U/L (ref 38–126)
Anion gap: 7 (ref 5–15)
BUN: 17 mg/dL (ref 6–20)
CO2: 29 mmol/L (ref 22–32)
Calcium: 8.5 mg/dL — ABNORMAL LOW (ref 8.9–10.3)
Chloride: 102 mmol/L (ref 98–111)
Creatinine, Ser: 1.27 mg/dL — ABNORMAL HIGH (ref 0.61–1.24)
GFR calc Af Amer: >60 mL/min (ref >60)
GFR calc non Af Amer: >60 mL/min (ref >60)
Glucose, Bld: 183 mg/dL — ABNORMAL HIGH (ref 70–99)
Potassium: 4.7 mmol/L (ref 3.5–5.1)
Sodium: 138 mmol/L (ref 135–145)
Total Bilirubin: 1 mg/dL (ref 0.3–1.2)
Total Protein: 5.7 g/dL — ABNORMAL LOW (ref 6.5–8.1)

## 2020-05-05 LAB — D-DIMER, QUANTITATIVE: D-Dimer, Quant: 0.65 ug/mL-FEU — ABNORMAL HIGH (ref 0.00–0.50)

## 2020-05-05 LAB — C-REACTIVE PROTEIN: CRP: 0.6 mg/dL (ref <1.0)

## 2020-05-05 LAB — GLUCOSE, CAPILLARY: Glucose-Capillary: 130 mg/dL — ABNORMAL HIGH (ref 70–99)

## 2020-05-05 MED ORDER — PREDNISONE 10 MG PO TABS: ORAL_TABLET | ORAL | 0 refills | Status: AC

## 2020-05-05 NOTE — Discharge Summary (Signed)
Physician Discharge Summary  Courage Biglow ZOX:[id number] DOB: 1985-11-03 DOA: 05/01/2020  PCP: Patient, No Pcp Per  Admit date: 05/01/2020 Discharge date: 05/05/2020  Admitted From: Home Disposition: Home  Recommendations for Outpatient Follow-up:  1. Follow up with PCP in 1-2 weeks 2. Please obtain BMP/CBC in one week 3. Please follow quarantine guidelines as outlined by the CDC for 21 days from initial positive Covid testing  Home Health: None Equipment/Devices: None indicated  Discharge Condition: Stable CODE STATUS: Full Diet recommendation: Regular diet as tolerated  Brief/Interim Summary: Andre Carey is a 34 y.o. male with no significant medical history who developed flu like illness symptoms 1 week prior and tested positive at home for covid-19 on 9/5 who presented to the ED 9/7 with worsening dyspnea. He was febrile to 103.68F, tachycardic, tachypneic, and hypoxic requiring NRB ultimately weaned to nasal cannula. CXR demonstrated multifocal pneumonia, CRP 6.6, so he was admitted for treatment of covid-19 pneumonia.  Patient completed Remdesivir course, continues on steroids but is now no longer having hypoxia, or prolonged respiratory symptoms.  Patient not able to ambulate without hypoxia on room air, otherwise stable and agreeable for discharge home.  We discussed need for ongoing quarantine for 21 days since Covid testing.  He will need close follow-up with PCP in the next 2 to 3 weeks as scheduled, we recommend vaccination once patient has completed quarantine later this month.  Discharge Diagnoses:  Principal Problem:   Acute hypoxemic respiratory failure due to COVID-19 [facility]) Active Problems:   Leukopenia   Thrombocytopenia (HCC)   Elevated liver enzymes   Hyponatremia  Discharge Instructions  Discharge Instructions    Call MD for:  difficulty breathing, headache or visual disturbances   Complete by: As directed    Discharge instructions   Complete by: As  directed    ?   Person Under Monitoring Name: Andre Carey  Location: [address] [place] [address]   Infection Prevention Recommendations for Individuals Confirmed to have, or Being Evaluated for, 2019 Novel Coronavirus (COVID-19) Infection Who Receive Care at Home  Individuals who are confirmed to have, or are being evaluated for, COVID-19 should follow the prevention steps below until a healthcare provider or local or state health department says they can return to normal activities.  Stay home except to get medical care You should restrict activities outside your home, except for getting medical care. Do not go to work, school, or public areas, and do not use public transportation or taxis.  Call ahead before visiting your doctor Before your medical appointment, call the healthcare provider and tell them that you have, or are being evaluated for, COVID-19 infection. This will help the healthcare provider's office take steps to keep other people from getting infected. Ask your healthcare provider to call the local or state health department.  Monitor your symptoms Seek prompt medical attention if your illness is worsening (e.g., difficulty breathing). Before going to your medical appointment, call the healthcare provider and tell them that you have, or are being evaluated for, COVID-19 infection. Ask your healthcare provider to call the local or state health department.  Wear a facemask You should wear a facemask that covers your nose and mouth when you are in the same room with other people and when you visit a healthcare provider. People who live with or visit you should also wear a facemask while they are in the same room with you.  Separate yourself from other people in your home As much  as possible, you should stay in a different room from other people in your home. Also, you should use a separate bathroom, if available.  Avoid sharing  household items You should not share dishes, drinking glasses, cups, eating utensils, towels, bedding, or other items with other people in your home. After using these items, you should wash them thoroughly with soap and water.  Cover your coughs and sneezes Cover your mouth and nose with a tissue when you cough or sneeze, or you can cough or sneeze into your sleeve. Throw used tissues in a lined trash can, and immediately wash your hands with soap and water for at least 20 seconds or use an alcohol-based hand rub.  Wash your [employer] your hands often and thoroughly with soap and water for at least 20 seconds. You can use an alcohol-based hand sanitizer if soap and water are not available and if your hands are not visibly dirty. Avoid touching your eyes, nose, and mouth with unwashed hands.   Prevention Steps for Caregivers and Household Members of Individuals Confirmed to have, or Being Evaluated for, COVID-19 Infection Being Cared for in the Home  If you live with, or provide care at home for, a person confirmed to have, or being evaluated for, COVID-19 infection please follow these guidelines to prevent infection:  Follow healthcare provider's instructions Make sure that you understand and can help the patient follow any healthcare provider instructions for all care.  Provide for the patient's basic needs You should help the patient with basic needs in the home and provide support for getting groceries, prescriptions, and other personal needs.  Monitor the patient's symptoms If they are getting sicker, call his or her medical provider and tell them that the patient has, or is being evaluated for, COVID-19 infection. This will help the healthcare provider's office take steps to keep other people from getting infected. Ask the healthcare provider to call the local or state health department.  Limit the number of people who have contact with the patient If possible, have only one  caregiver for the patient. Other household members should stay in another home or place of residence. If this is not possible, they should stay in another room, or be separated from the patient as much as possible. Use a separate bathroom, if available. Restrict visitors who do not have an essential need to be in the home.  Keep older adults, very young children, and other sick people away from the patient Keep older adults, very young children, and those who have compromised immune systems or chronic health conditions away from the patient. This includes people with chronic heart, lung, or kidney conditions, diabetes, and cancer.  Ensure good ventilation Make sure that shared spaces in the home have good air flow, such as from an air conditioner or an opened window, weather permitting.  Wash your hands often Wash your hands often and thoroughly with soap and water for at least 20 seconds. You can use an alcohol based hand sanitizer if soap and water are not available and if your hands are not visibly dirty. Avoid touching your eyes, nose, and mouth with unwashed hands. Use disposable paper towels to dry your hands. If not available, use dedicated cloth towels and replace them when they become wet.  Wear a facemask and gloves Wear a disposable facemask at all times in the room and gloves when you touch or have contact with the patient's blood, body fluids, and/or secretions or excretions, such as sweat,  saliva, sputum, nasal mucus, vomit, urine, or feces.  Ensure the mask fits over your nose and mouth tightly, and do not touch it during use. Throw out disposable facemasks and gloves after using them. Do not reuse. Wash your hands immediately after removing your facemask and gloves. If your personal clothing becomes contaminated, carefully remove clothing and launder. Wash your hands after handling contaminated clothing. Place all used disposable facemasks, gloves, and other waste in a lined  container before disposing them with other household waste. Remove gloves and wash your hands immediately after handling these items.  Do not share dishes, glasses, or other household items with the patient Avoid sharing household items. You should not share dishes, drinking glasses, cups, eating utensils, towels, bedding, or other items with a patient who is confirmed to have, or being evaluated for, COVID-19 infection. After the person uses these items, you should wash them thoroughly with soap and water.  Wash laundry thoroughly Immediately remove and wash clothes or bedding that have blood, body fluids, and/or secretions or excretions, such as sweat, saliva, sputum, nasal mucus, vomit, urine, or feces, on them. Wear gloves when handling laundry from the patient. Read and follow directions on labels of laundry or clothing items and detergent. In general, wash and dry with the warmest temperatures recommended on the label.  Clean all areas the individual has used often Clean all touchable surfaces, such as counters, tabletops, doorknobs, bathroom fixtures, toilets, phones, keyboards, tablets, and bedside tables, every day. Also, clean any surfaces that may have blood, body fluids, and/or secretions or excretions on them. Wear gloves when cleaning surfaces the patient has come in contact with. Use a diluted bleach solution (e.g., dilute bleach with 1 part bleach and 10 parts water) or a household disinfectant with a label that says EPA-registered for coronaviruses. To make a bleach solution at home, add 1 tablespoon of bleach to 1 quart (4 cups) of water. For a larger supply, add  cup of bleach to 1 gallon (16 cups) of water. Read labels of cleaning products and follow recommendations provided on product labels. Labels contain instructions for safe and effective use of the cleaning product including precautions you should take when applying the product, such as wearing gloves or eye protection and  making sure you have good ventilation during use of the product. Remove gloves and wash hands immediately after cleaning.  Monitor yourself for signs and symptoms of illness Caregivers and household members are considered close contacts, should monitor their health, and will be asked to limit movement outside of the home to the extent possible. Follow the monitoring steps for close contacts listed on the symptom monitoring form.   ? If you have additional questions, contact your local health department or call the epidemiologist on call at [phone] (available 24/7). ? This guidance is subject to change. For the most up-to-date guidance from [facility], please refer to their website: [email or web address].hu   Increase activity slowly   Complete by: As directed      Allergies as of 05/05/2020   No Known Allergies     Medication List    TAKE these medications   predniSONE 10 MG tablet Commonly known as: DELTASONE Take 4 tablets (40 mg total) by mouth daily for 3 days, THEN 3 tablets (30 mg total) daily for 3 days, THEN 2 tablets (20 mg total) daily for 3 days, THEN 1 tablet (10 mg total) daily for 3 days. Start taking on: May 05, 2020  No Known Allergies  Consultations:  None   Procedures/Studies: DG Chest Portable 1 View  Result Date: 05/01/2020 CLINICAL DATA:  Hypoxia. EXAM: PORTABLE CHEST 1 VIEW COMPARISON:  None. FINDINGS: The heart size and mediastinal contours are within normal limits. No pneumothorax or pleural effusion is noted. Bilateral patchy airspace opacities are noted concerning for multifocal pneumonia, possibly due to COVID-19. The visualized skeletal structures are unremarkable. IMPRESSION: Bilateral patchy airspace opacities are noted concerning for multifocal pneumonia, possibly due to COVID-19. Electronically Signed   By: Lupita Raider M.D.   On: 05/01/2020 15:18   US Abdomen Limited RUQ  Result  Date: 05/01/2020 CLINICAL DATA:  Elevated liver function tests EXAM: ULTRASOUND ABDOMEN LIMITED RIGHT UPPER QUADRANT COMPARISON:  None. FINDINGS: Gallbladder: No gallstones or wall thickening visualized. No sonographic Murphy sign noted by sonographer. Common bile duct: Diameter: 3 mm Liver: No focal lesion identified. Within normal limits in parenchymal echogenicity. Portal vein is patent on color Doppler imaging with normal direction of blood flow towards the liver. Other: None. IMPRESSION: 1. Unremarkable right upper quadrant ultrasound. Electronically Signed   By: Sharlet Salina M.D.   On: 05/01/2020 19:24     Subjective: No acute issues or events overnight, patient denies any chest pain, dyspnea with exertion, nausea, vomiting, diarrhea, constipation, headache, fevers, chills.   Discharge Exam: Vitals:   05/04/20 1847 05/05/20 0500  BP: 118/72 127/81  Pulse: 70 (!) 56  Resp: (!) 22 20  Temp:  98.4 F (36.9 C)  SpO2: 91% 94%   Vitals:   05/04/20 1143 05/04/20 1827 05/04/20 1847 05/05/20 0500  BP: 123/72  118/72 127/81  Pulse: 60  70 (!) 56  Resp: (!) 22 20 (!) 22 20  Temp: 98.1 F (36.7 C)   98.4 F (36.9 C)  TempSrc: Oral   Oral  SpO2: 93%  91% 94%  Weight:    84.4 kg  Height:        General: Pt is alert, awake, not in acute distress Cardiovascular: RRR, S1/S2 +, no rubs, no gallops Respiratory: CTA bilaterally, no wheezing, no rhonchi Abdominal: Soft, NT, ND, bowel sounds + Extremities: no edema, no cyanosis    The results of significant diagnostics from this hospitalization (including imaging, microbiology, ancillary and laboratory) are listed below for reference.     Microbiology: Recent Results (from the past 240 hour(s))  Blood Culture (routine x 2)     Status: None (Preliminary result)   Collection Time: 05/01/20  3:30 PM   Specimen: BLOOD  Result Value Ref Range Status   Specimen Description BLOOD BLOOD RIGHT FOREARM  Final   Special Requests   Final     BOTTLES DRAWN AEROBIC AND ANAEROBIC Blood Culture adequate volume   Culture   Final    NO GROWTH 3 DAYS Performed at [facility], [address] [place], [place] [address]    Report Status PENDING  Incomplete  SARS Coronavirus 2 by RT PCR (hospital order, performed in [facility] hospital lab) Nasopharyngeal Nasopharyngeal Swab     Status: Abnormal   Collection Time: 05/01/20  3:31 PM   Specimen: Nasopharyngeal Swab  Result Value Ref Range Status   SARS Coronavirus 2 POSITIVE (A) NEGATIVE Final    Comment: RESULT CALLED TO, READ BACK BY AND VERIFIED WITH: L SHULAR RN 05/01/20 AT 1809  (NOTE) SARS-CoV-2 target nucleic acids are DETECTED  SARS-CoV-2 RNA is generally detectable in upper respiratory specimens  during the acute phase of infection.  Positive results  are indicative  of the presence of the identified virus, but do not rule out bacterial infection or co-infection with other pathogens not detected by the test.  Clinical correlation with patient history and  other diagnostic information is necessary to determine patient infection status.  The expected result is negative.  Fact Sheet for Patients:   BoilerBrush.com.cyhttps://www.fda.gov/media/136312/download   Fact Sheet for Healthcare Providers:   https://pope.com/https://www.fda.gov/media/136313/download    This test is not yet approved or cleared by the [place] FDA and  has been authorized for detection and/or diagnosis of SARS-CoV-2 by FDA under an Emergency Use Authorization (EUA).  This EUA will remain in effect (meaning this test ca n be used) for the duration of  the COVID-19 declaration under Section 564(b)(1) of the Act, 21 U.S.C. section 360-bbb-3(b)(1), unless the authorization is terminated or revoked sooner.  Performed at [facility], [address] [place], [place] [address]   Blood Culture (routine x 2)     Status: None (Preliminary result)   Collection Time: 05/01/20  3:36 PM   Specimen: BLOOD  Result Value Ref  Range Status   Specimen Description BLOOD SITE NOT SPECIFIED  Final   Special Requests   Final    BOTTLES DRAWN AEROBIC AND ANAEROBIC Blood Culture results may not be optimal due to an inadequate volume of blood received in culture bottles   Culture   Final    NO GROWTH 3 DAYS Performed at [facility], [address] [place], [place] [address]    Report Status PENDING  Incomplete     Labs: BNP (last 3 results) Recent Labs    05/01/20 1801  BNP 7.6   Basic Metabolic Panel: Recent Labs  Lab 05/01/20 1531 05/02/20 0428 05/03/20 0949 05/04/20 0339 05/05/20 0452  NA 129* 133* 137 137 138  K 3.7 4.2 4.0 4.0 4.7  CL 95* 95* 98 99 102  CO2 25 28 30 29 29   GLUCOSE 132* 158* 142* 191* 183*  BUN 8 10 15 18 17   CREATININE 1.41* 1.53* 1.36* 1.22 1.27*  CALCIUM 8.3* 8.7* 8.5* 8.6* 8.5*  MG  --  2.3  --   --   --   PHOS  --  4.7*  --   --   --    Liver Function Tests: Recent Labs  Lab 05/01/20 1531 05/02/20 0428 05/03/20 0949 05/04/20 0339 05/05/20 0452  AST 181* 219* 167* 152* 175*  ALT 104* 130* 145* 154* 219*  ALKPHOS 52 52 52 55 55  BILITOT 0.8 0.7 0.9 1.1 1.0  PROT 6.5 6.9 6.3* 5.8* 5.7*  ALBUMIN 3.1* 3.1* 2.8* 2.8* 2.7*   No results for input(s): LIPASE, AMYLASE in the last 168 hours. No results for input(s): AMMONIA in the last 168 hours. CBC: Recent Labs  Lab 05/01/20 1456 05/02/20 0428 05/03/20 0949  WBC 3.3* 2.6* 8.1  NEUTROABS  --  2.2 6.2  HGB 16.8 16.9 16.4  HCT 49.6 50.2 47.5  MCV 85.8 88.2 86.8  PLT 131* 147* 212   Cardiac Enzymes: No results for input(s): CKTOTAL, CKMB, CKMBINDEX, TROPONINI in the last 168 hours. BNP: Invalid input(s): POCBNP CBG: Recent Labs  Lab 05/04/20 0739 05/04/20 1126 05/04/20 1655 05/04/20 2219 05/05/20 0732  GLUCAP 177* 132* 199* 168* 130*   D-Dimer Recent Labs    05/04/20 0339 05/05/20 0452  DDIMER 0.68* 0.65*   Hgb A1c Recent Labs    05/03/20 0949  HGBA1C 5.8*   Lipid Profile No results  for input(s): CHOL, HDL, LDLCALC, TRIG, CHOLHDL, LDLDIRECT in the last 72 hours. Thyroid function studies No results for input(s): TSH, T4TOTAL, T3FREE, THYROIDAB in the last 72 hours.  Invalid input(s): FREET3 Anemia work up No results for input(s): VITAMINB12, FOLATE, FERRITIN, TIBC, IRON, RETICCTPCT in the last 72 hours. Urinalysis No results found for: COLORURINE, APPEARANCEUR, LABSPEC, PHURINE, GLUCOSEU, HGBUR, BILIRUBINUR, KETONESUR, PROTEINUR, UROBILINOGEN, NITRITE, LEUKOCYTESUR Sepsis Labs Invalid input(s): PROCALCITONIN,  WBC,  LACTICIDVEN Microbiology Recent Results (from the past 240 hour(s))  Blood Culture (routine x 2)     Status: None (Preliminary result)   Collection Time: 05/01/20  3:30 PM   Specimen: BLOOD  Result Value Ref Range Status   Specimen Description BLOOD BLOOD RIGHT FOREARM  Final   Special Requests   Final    BOTTLES DRAWN AEROBIC AND ANAEROBIC Blood Culture adequate volume   Culture   Final    NO GROWTH 3 DAYS Performed at [facility], [address], [place], [place] [address]    Report Status PENDING  Incomplete  SARS Coronavirus 2 by RT PCR (hospital order, performed in [facility] hospital lab) Nasopharyngeal Nasopharyngeal Swab     Status: Abnormal   Collection Time: 05/01/20  3:31 PM   Specimen: Nasopharyngeal Swab  Result Value Ref Range Status   SARS Coronavirus 2 POSITIVE (A) NEGATIVE Final    Comment: RESULT CALLED TO, READ BACK BY AND VERIFIED WITH: L SHULAR RN 05/01/20 AT 1809  (NOTE) SARS-CoV-2 target nucleic acids are DETECTED  SARS-CoV-2 RNA is generally detectable in upper respiratory specimens  during the acute phase of infection.  Positive results are indicative  of the presence of the identified virus, but do not rule out bacterial infection or co-infection with other pathogens not detected by the test.  Clinical correlation with patient history and  other diagnostic information is necessary to determine  patient infection status.  The expected result is negative.  Fact Sheet for Patients:   BoilerBrush.com.cy   Fact Sheet for Healthcare Providers:   https://pope.com/    This test is not yet approved or cleared by the [place] FDA and  has been authorized for detection and/or diagnosis of SARS-CoV-2 by FDA under an Emergency Use Authorization (EUA).  This EUA will remain in effect (meaning this test ca n be used) for the duration of  the COVID-19 declaration under Section 564(b)(1) of the Act, 21 U.S.C. section 360-bbb-3(b)(1), unless the authorization is terminated or revoked sooner.  Performed at [facility], [address], [place], [place] [address]   Blood Culture (routine x 2)     Status: None (Preliminary result)   Collection Time: 05/01/20  3:36 PM   Specimen: BLOOD  Result Value Ref Range Status   Specimen Description BLOOD SITE NOT SPECIFIED  Final   Special Requests   Final    BOTTLES DRAWN AEROBIC AND ANAEROBIC Blood Culture results may not be optimal due to an inadequate volume of blood received in culture bottles   Culture   Final    NO GROWTH 3 DAYS Performed at [facility], [address], [place], [place] [address]    Report Status PENDING  Incomplete     Time coordinating discharge: Over 30 minutes  SIGNED:   Azucena Fallen, DO [facility] 05/05/2020, 11:58 AM

## 2020-05-06 LAB — CULTURE, BLOOD (ROUTINE X 2)
Culture: NO GROWTH
Culture: NO GROWTH
Special Requests: ADEQUATE

## 2022-01-06 IMAGING — DX DG CHEST 1V PORT
1 series · 1 of 1 positions shown · non-contrast
Comparison: None.

CLINICAL DATA: Hypoxia.

EXAM:
PORTABLE CHEST 1 VIEW

[chest ap]
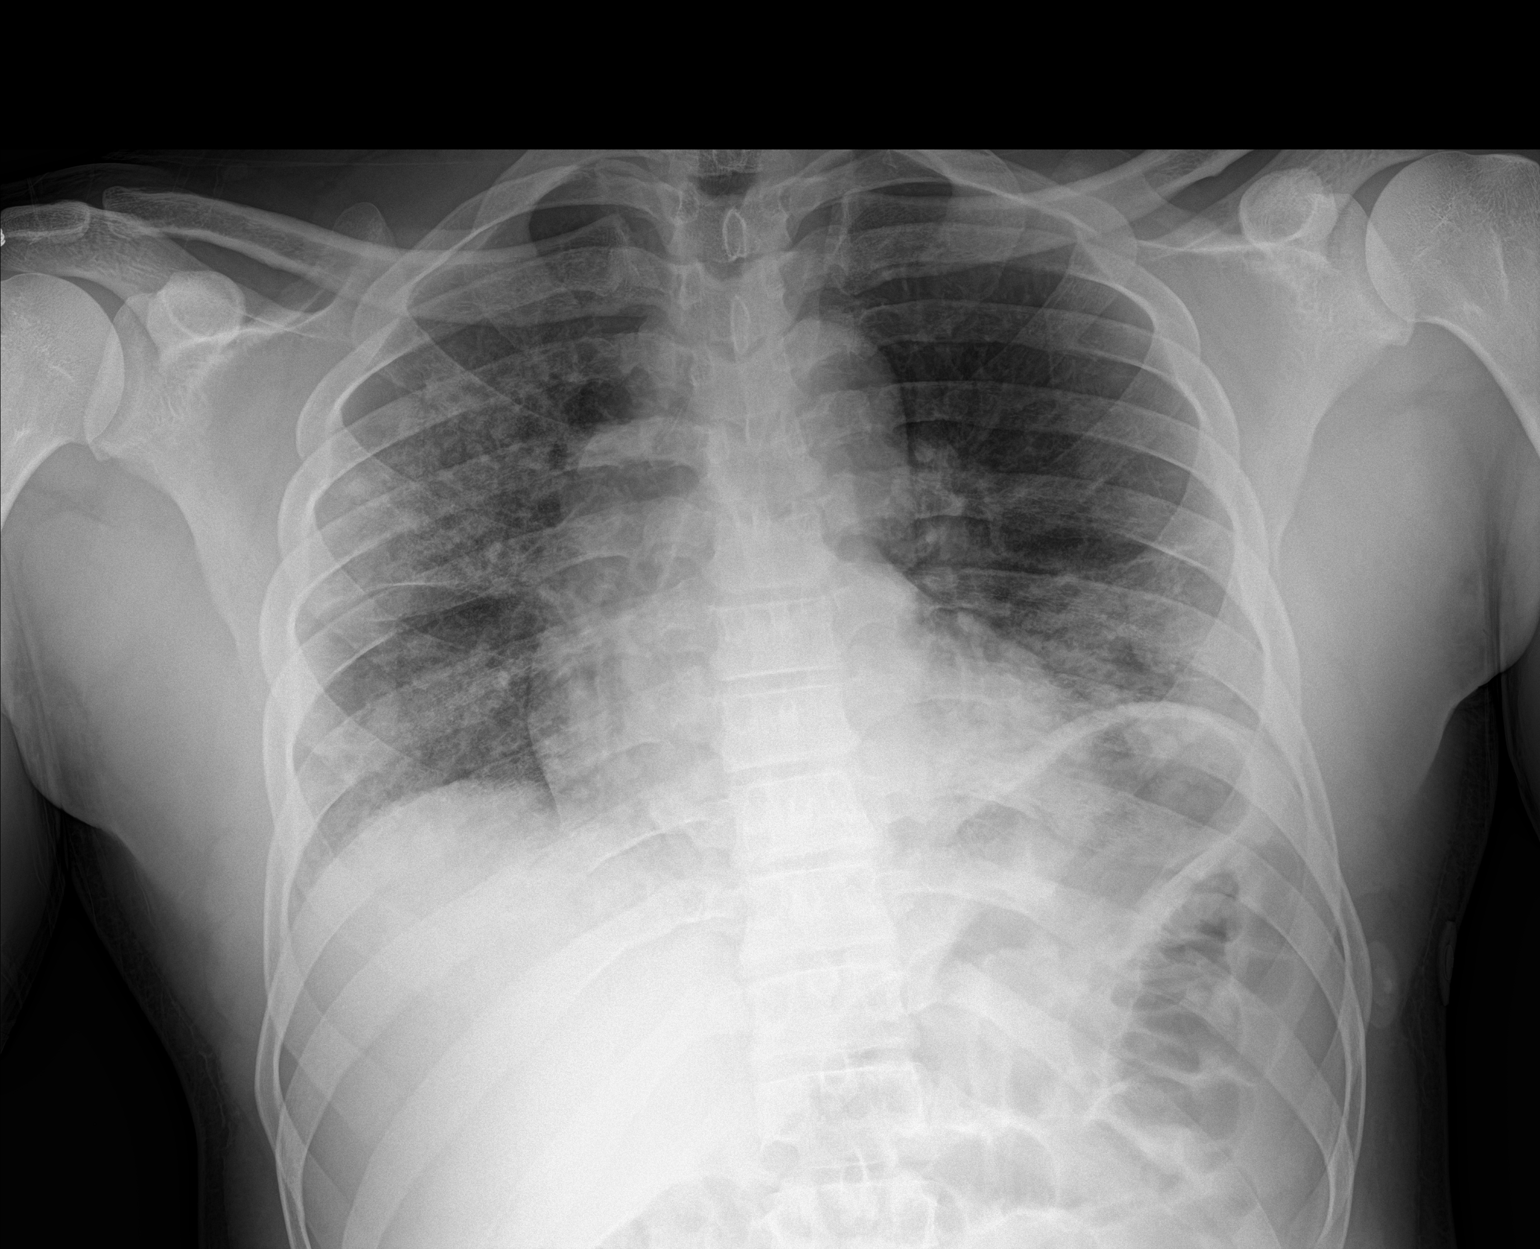

[1 of 1 positions shown; findings below may reference images not displayed]

FINDINGS: The heart size and mediastinal contours are within normal limits. No
pneumothorax or pleural effusion is noted. Bilateral patchy airspace
opacities are noted concerning for multifocal pneumonia, possibly
due to [id number]. The visualized skeletal structures are
unremarkable.
IMPRESSION: Bilateral patchy airspace opacities are noted concerning for
multifocal pneumonia, possibly due to [id number].

## 2022-01-06 IMAGING — US US ABDOMEN LIMITED
1 series · 14 of 25 positions shown · non-contrast
Comparison: None.

CLINICAL DATA: Elevated liver function tests

EXAM:
ULTRASOUND ABDOMEN LIMITED RIGHT UPPER QUADRANT

[Series 1: us abdomen limited ruq · 14 of 30 slices shown]
[im 1/30]
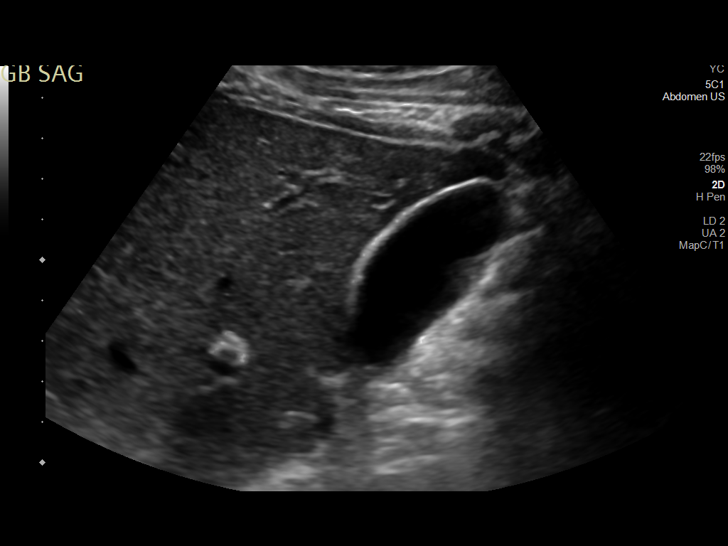
[im 3/30]
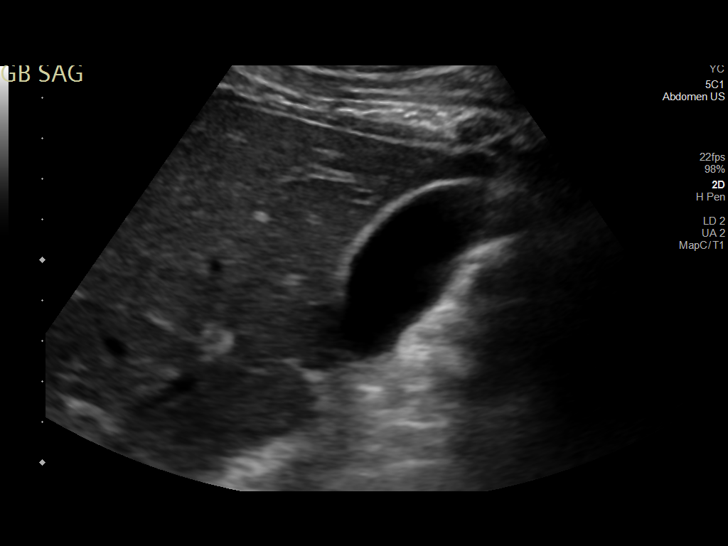
[im 5/30]
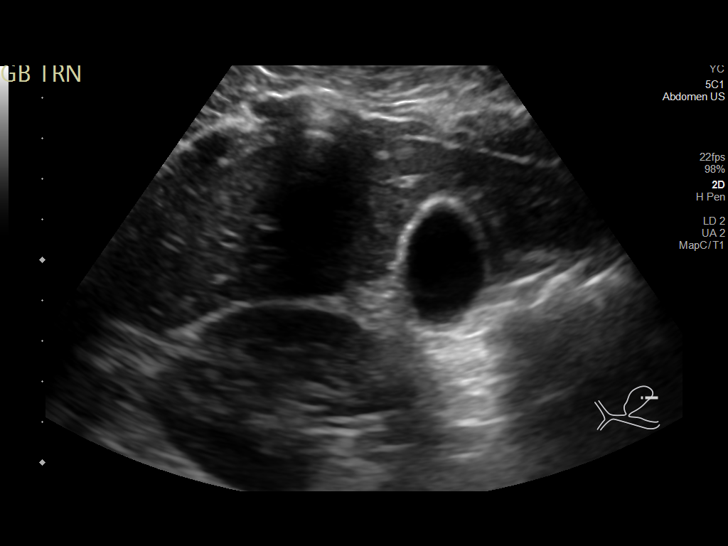
[im 8/30]
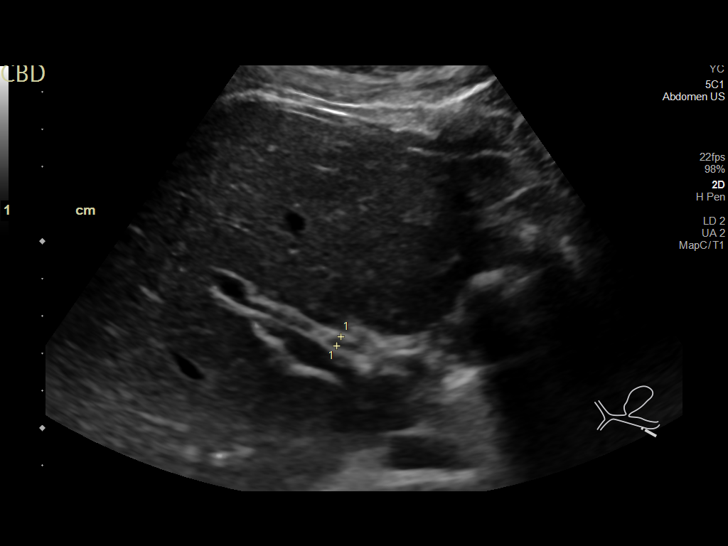
[im 10/30]
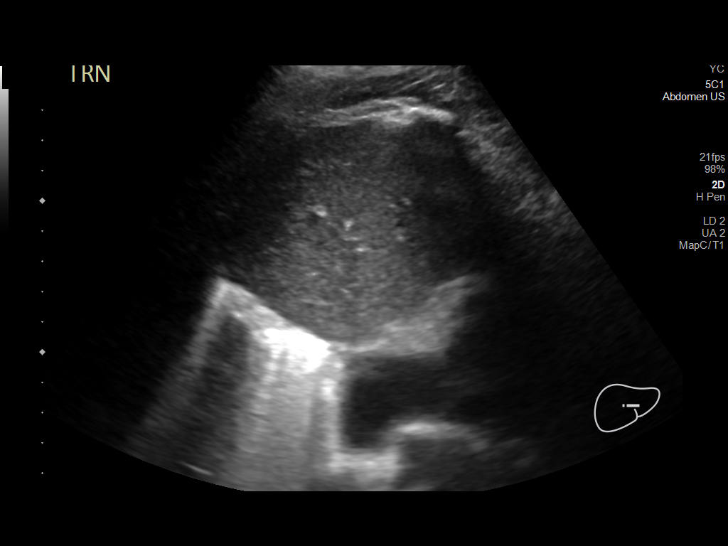
[im 11/30]
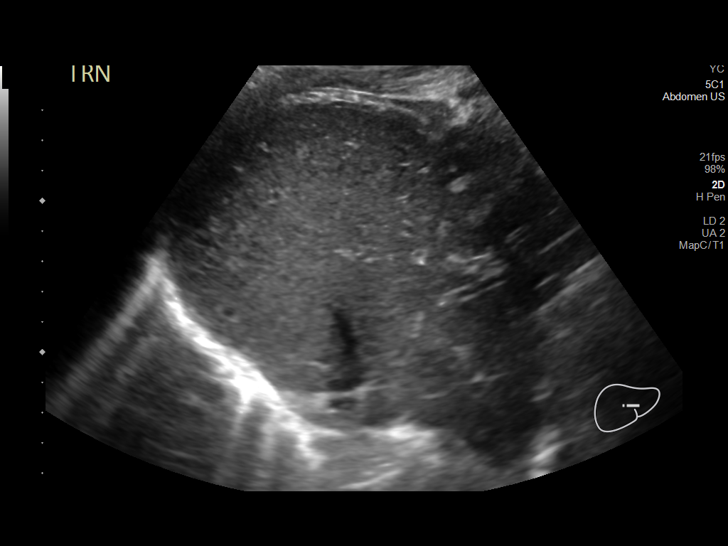
[im 14/30]
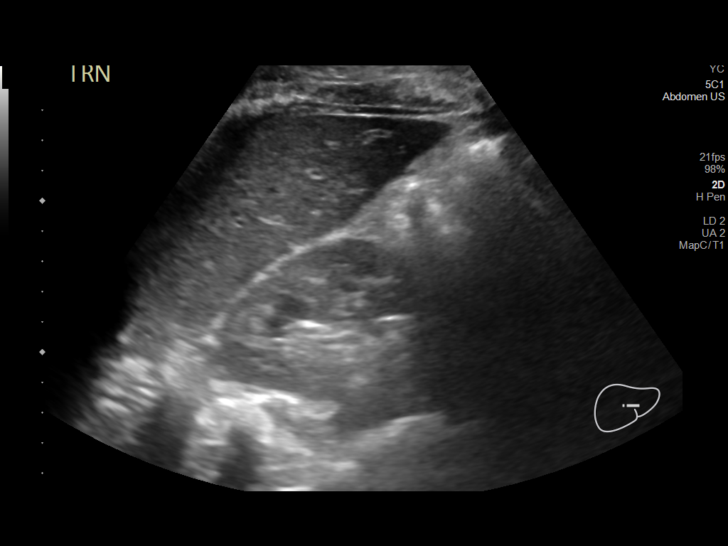
[im 16/30]
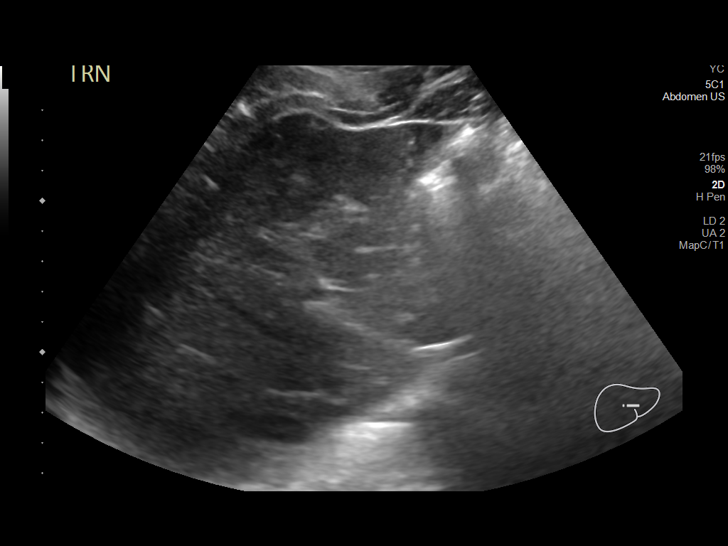
[im 19/30]
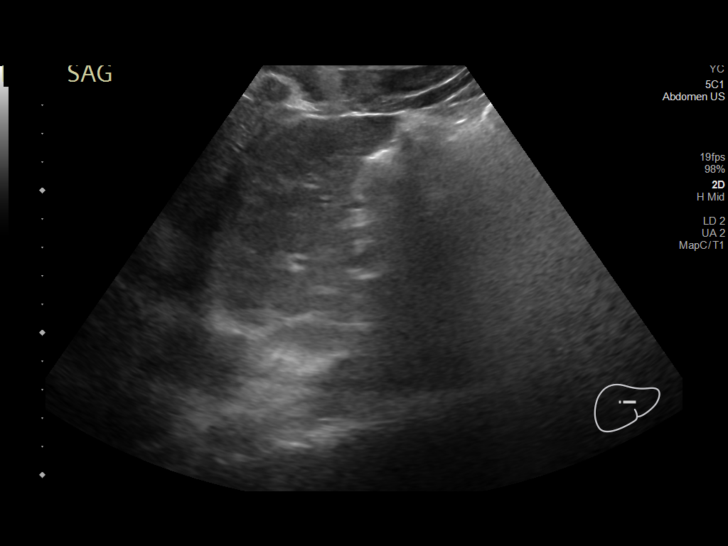
[im 20/30]
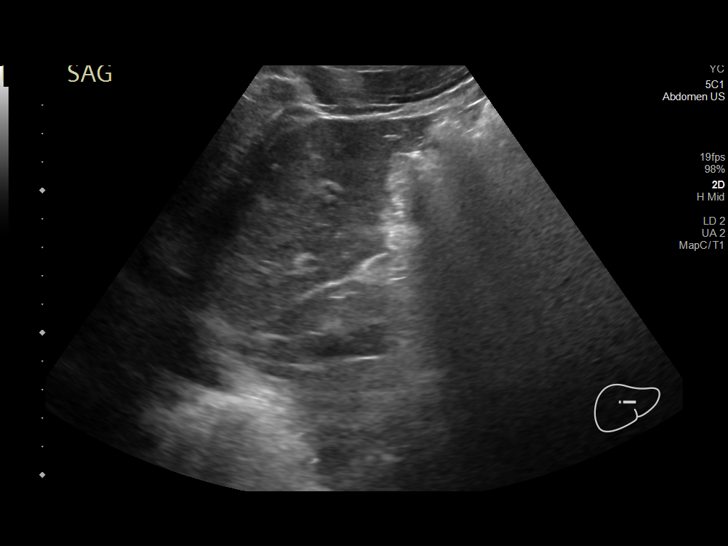
[im 22/30]
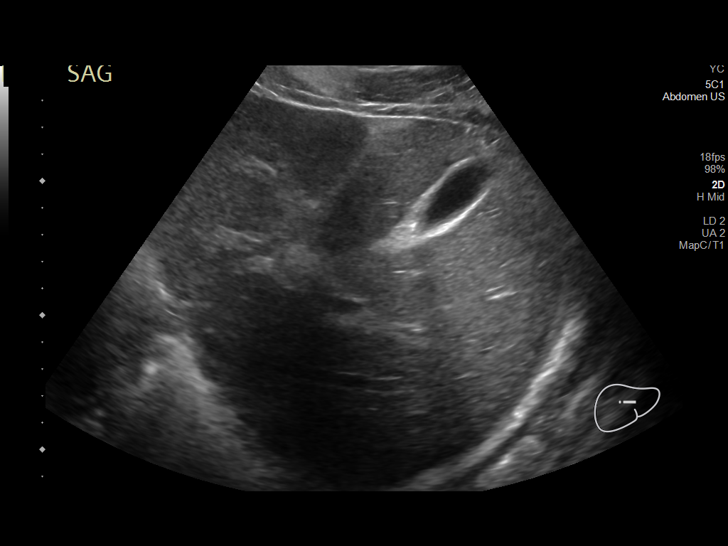
[im 25/30]
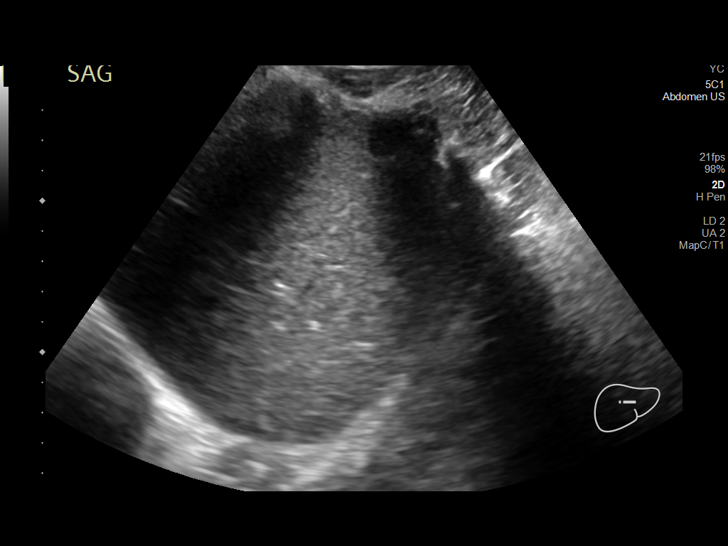
[im 27/30]
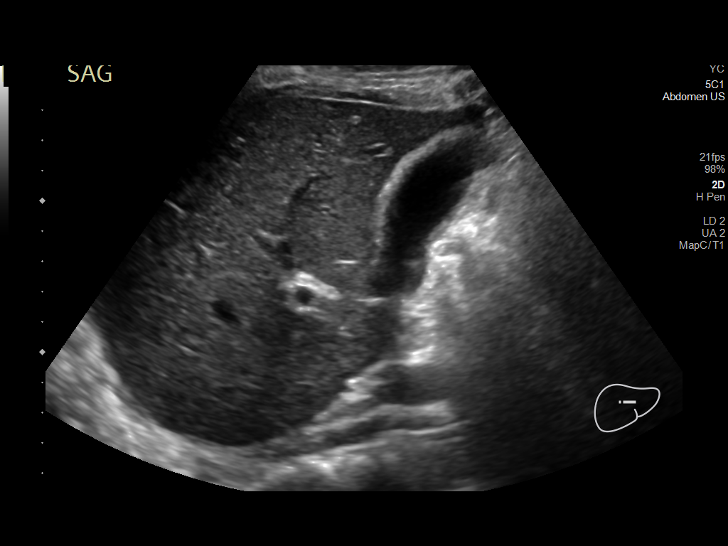
[im 30/30]
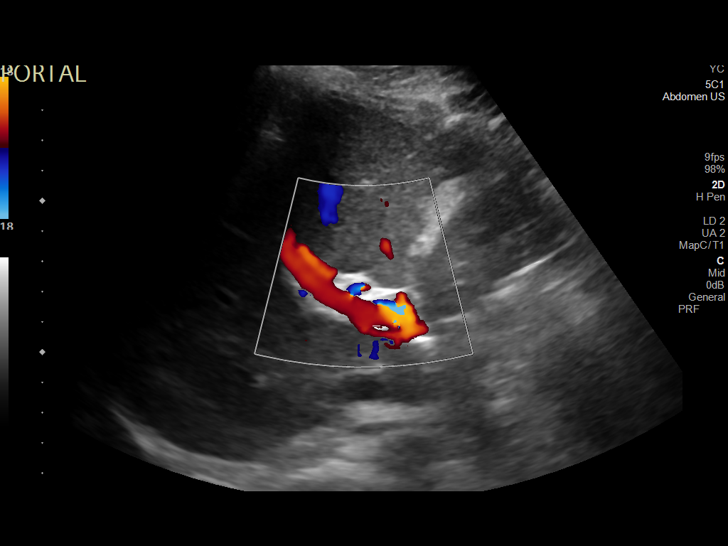

[14 of 25 positions shown; findings below may reference images not displayed]

FINDINGS: Gallbladder:

No gallstones or wall thickening visualized. No sonographic Murphy
sign noted by sonographer.

Common bile duct:

Diameter: 3 mm

Liver:

No focal lesion identified. Within normal limits in parenchymal
echogenicity. Portal vein is patent on color Doppler imaging with
normal direction of blood flow towards the liver.

Other: None.
IMPRESSION: 1. Unremarkable right upper quadrant ultrasound.
